# Patient Record
Sex: Female | Born: 1990 | Race: White | Hispanic: No | Marital: Single | Smoking: Current every day smoker
Health system: Southern US, Community
[De-identification: ages and names within clinical notes are randomized; demographics above are authoritative.]

## PROBLEM LIST (undated history)

## (undated) DIAGNOSIS — N760 Acute vaginitis: Secondary | ICD-10-CM

## (undated) DIAGNOSIS — A5901 Trichomonal vulvovaginitis: Secondary | ICD-10-CM

## (undated) DIAGNOSIS — R87629 Unspecified abnormal cytological findings in specimens from vagina: Secondary | ICD-10-CM

## (undated) DIAGNOSIS — R7303 Prediabetes: Secondary | ICD-10-CM

## (undated) DIAGNOSIS — B9689 Other specified bacterial agents as the cause of diseases classified elsewhere: Secondary | ICD-10-CM

## (undated) DIAGNOSIS — N83209 Unspecified ovarian cyst, unspecified side: Secondary | ICD-10-CM

## (undated) HISTORY — DX: Trichomonal vulvovaginitis: A59.01

## (undated) HISTORY — PX: NOSE SURGERY: SHX723

## (undated) HISTORY — DX: Other specified bacterial agents as the cause of diseases classified elsewhere: B96.89

## (undated) HISTORY — DX: Acute vaginitis: N76.0

## (undated) HISTORY — DX: Unspecified abnormal cytological findings in specimens from vagina: R87.629

---
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
## 2010-02-09 IMAGING — CT CT MAXILLOFACIAL WITHOUT CONTRAST
1 series · 15 of 30 positions shown, 19 images · non-contrast
Comparison: none

REASON FOR EXAM: left orbit pain and nasal bones/ fall    ENT   preg test
pending
COMMENTS:   LMP: > one month ago

[Series 2: facial 3.0 h60f · axial · 0.34mm/px · z∈[+1094,+1196]mm · 15 of 38 slices shown, 19 images]
[im 2/38  brain]
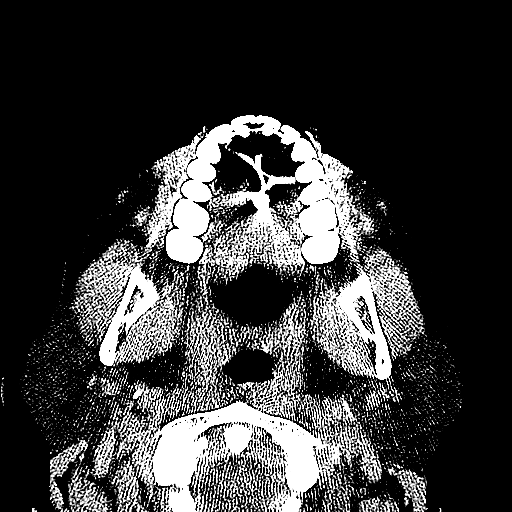
[im 2/38  bone]
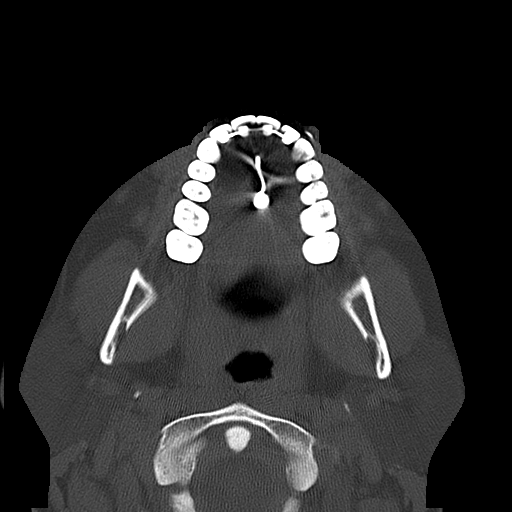
[im 4/38  bone]
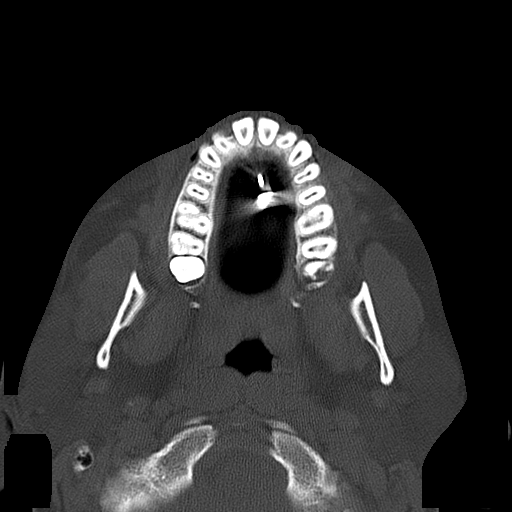
[im 7/38  bone]
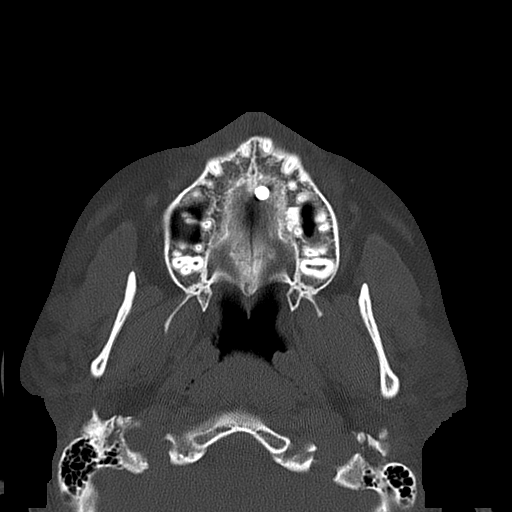
[im 9/38  bone]
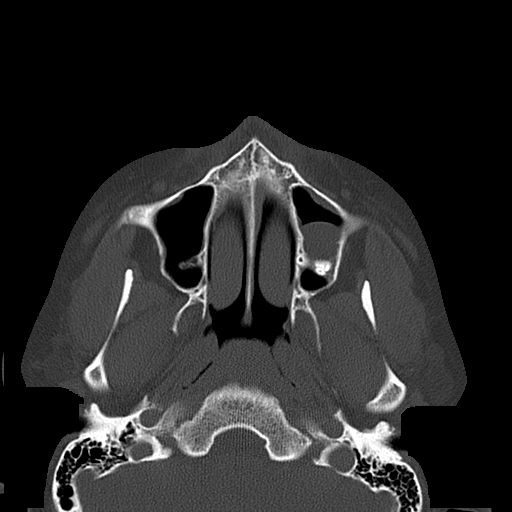
[im 12/38  brain]
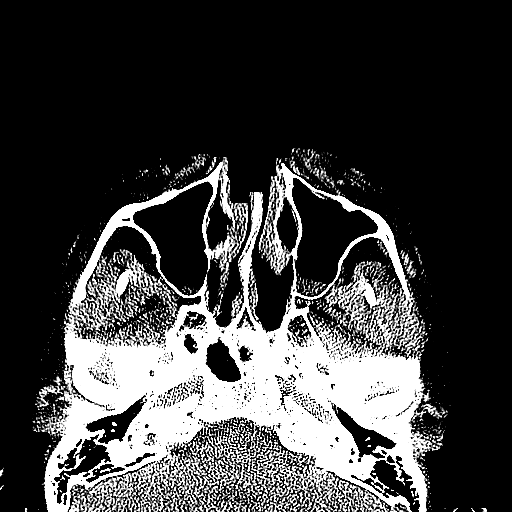
[im 12/38  bone]
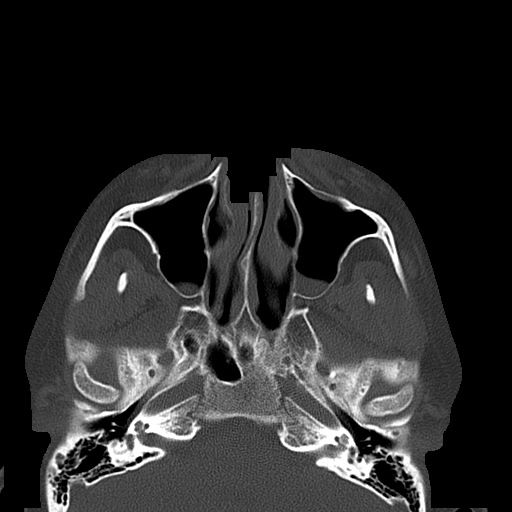
[im 15/38  bone]
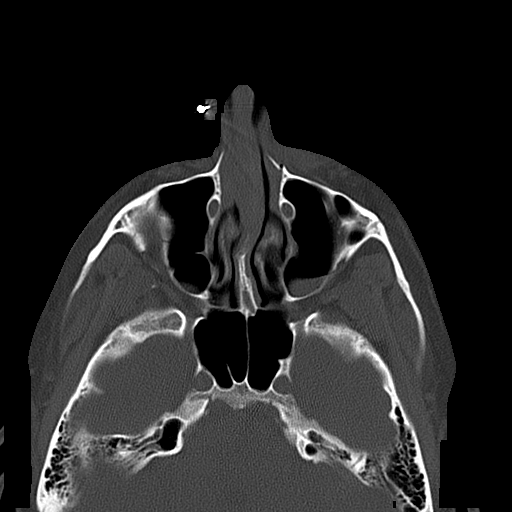
[im 17/38  bone]
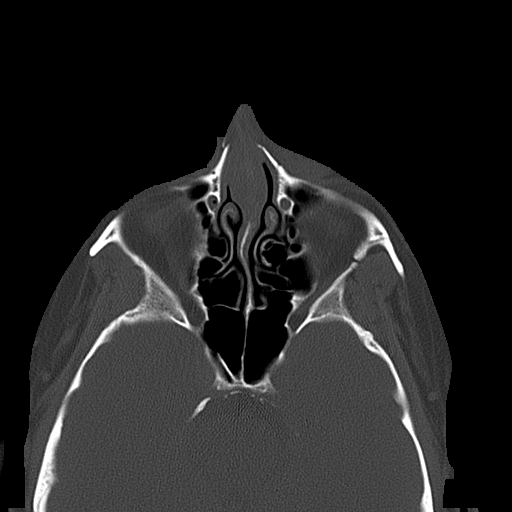
[im 20/38  bone]
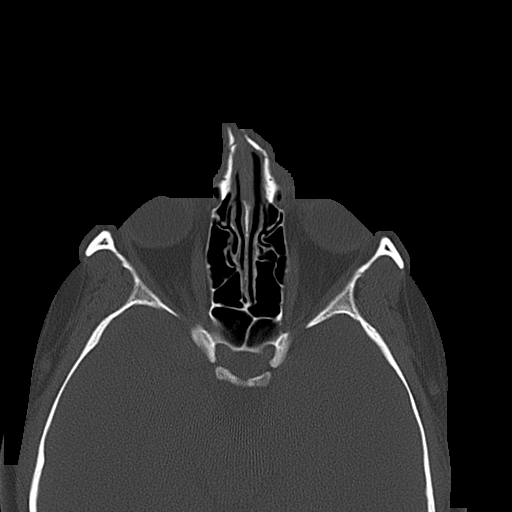
[im 21/38  brain]
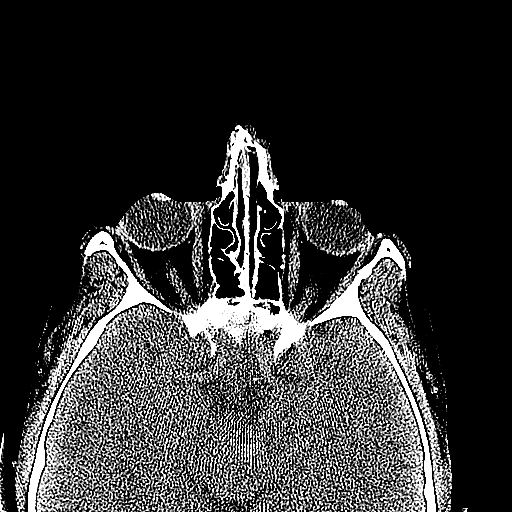
[im 21/38  bone]
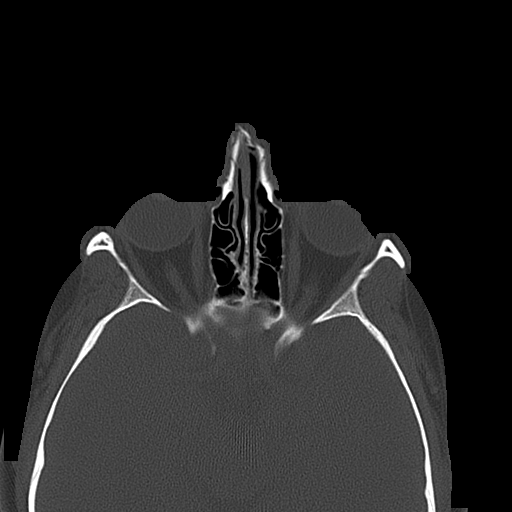
[im 23/38  bone]
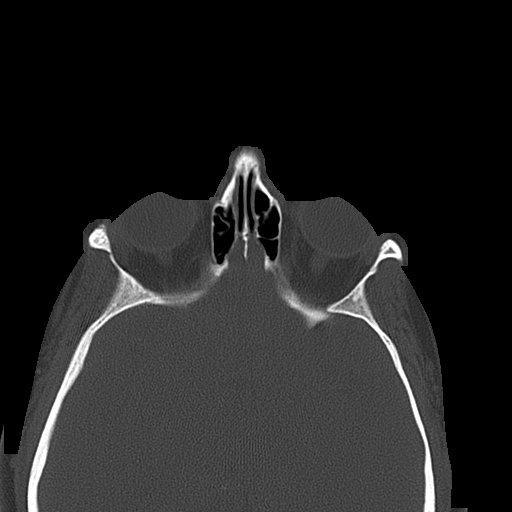
[im 26/38  bone]
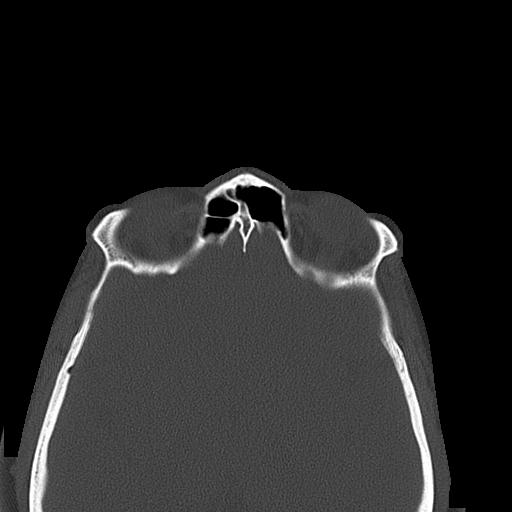
[im 29/38  bone]
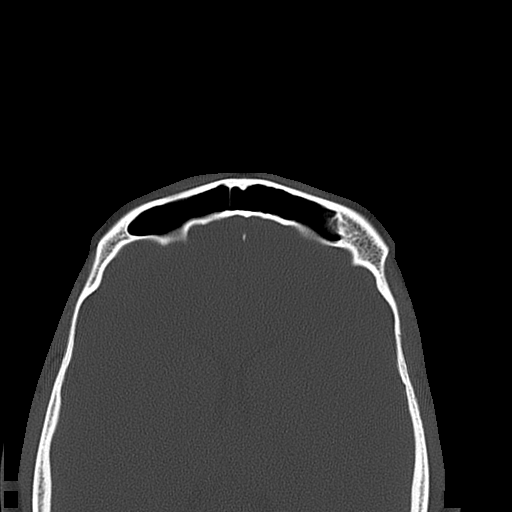
[im 31/38  brain]
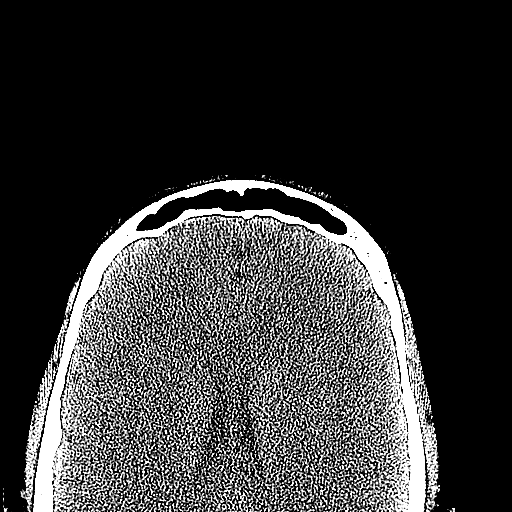
[im 31/38  bone]
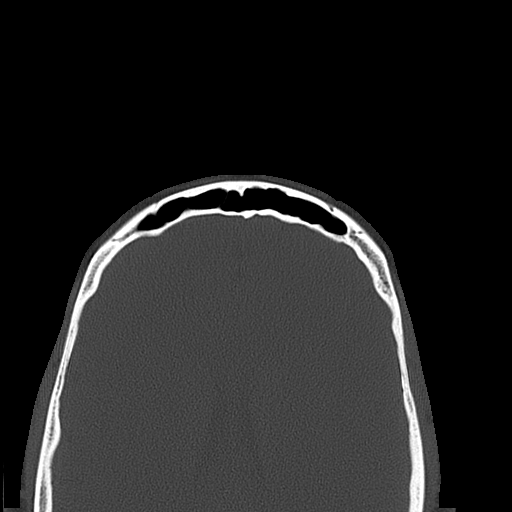
[im 34/38  bone]
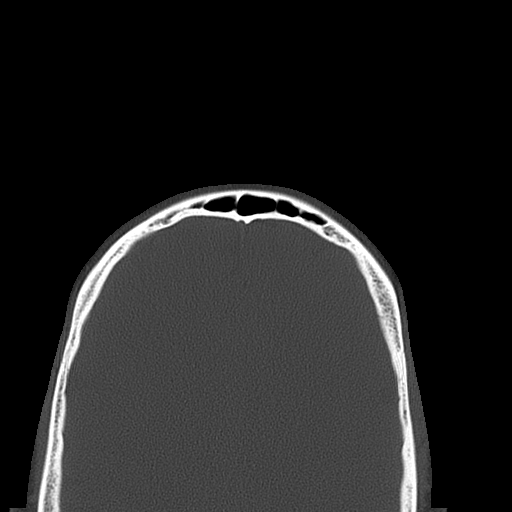
[im 36/38  bone]
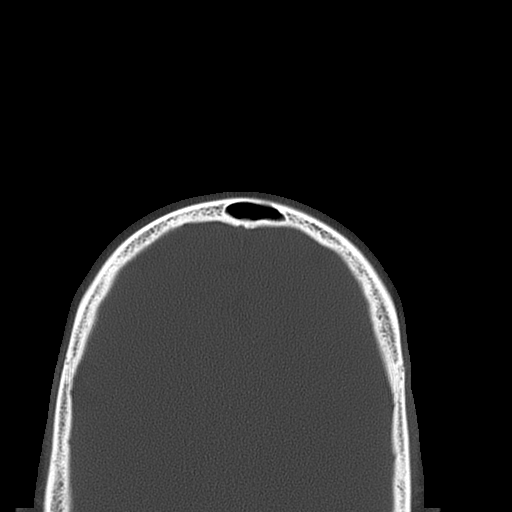

[15 of 30 positions shown; findings below may reference images not displayed]

PROCEDURE:     CT  - CT MAXILLOFACIAL AREA WO  - February 10, 2008  [DATE]

RESULT:     Multiplanar imaging of the bones of the face were obtained
utilizing bone algorithm.

Bilateral nasal bone fracture is identified involving the tip of the RIGHT
and LEFT nasal bones demonstrating angulation to the RIGHT and minimal
displacement of the LEFT fracture.  A nondisplaced fracture is demonstrated
along the base of the LEFT nasal bone.  Multiple fractures are identified
along the RIGHT nasal bone. Mild air fluid levels are identified within the
RIGHT and LEFT maxillary sinuses which may represent the sequela of sinus
disease or possibly posttraumatic. A fracture is also identified along the
anterior wall of the LEFT maxillary sinus. Extraocular muscles appear to be
intact.
IMPRESSION: 1.     Multiple fractures involving the nasal bones with resulting
angulation to the LEFT.
2.     Minimally depressed fracture along the anterior wall of the maxillary
sinus on the LEFT.  Mild bilateral air fluid levels which may be secondary
to the previously described trauma or possibly due to sinus disease.
3.     Erick Uriel Teichroeb of the Emergency Department was informed of these
findings at the time of the initial interpretation.

## 2011-11-22 ENCOUNTER — Encounter (HOSPITAL_COMMUNITY): Payer: Self-pay | Admitting: Emergency Medicine

## 2011-11-22 DIAGNOSIS — F17200 Nicotine dependence, unspecified, uncomplicated: Secondary | ICD-10-CM | POA: Insufficient documentation

## 2011-11-22 DIAGNOSIS — R109 Unspecified abdominal pain: Secondary | ICD-10-CM | POA: Insufficient documentation

## 2011-11-22 NOTE — ED Notes (Signed)
Patient c/o abdominal pain x 1 1/2 months; states has been having nausea.

## 2011-11-23 ENCOUNTER — Emergency Department (HOSPITAL_COMMUNITY)
Admission: EM | Admit: 2011-11-23 | Discharge: 2011-11-23 | Disposition: A | Discharge status: Home / Self Care | Payer: Self-pay | Attending: Emergency Medicine | Admitting: Emergency Medicine

## 2011-11-23 DIAGNOSIS — K2970 Gastritis, unspecified, without bleeding: Secondary | ICD-10-CM

## 2011-11-23 HISTORY — DX: Unspecified ovarian cyst, unspecified side: N83.209

## 2011-11-23 LAB — URINALYSIS, ROUTINE W REFLEX MICROSCOPIC
Ketones, ur: NEGATIVE mg/dL
Leukocytes, UA: NEGATIVE
Nitrite: POSITIVE — AB
Protein, ur: NEGATIVE mg/dL
Urobilinogen, UA: 0.2 mg/dL (ref 0.0–1.0)

## 2011-11-23 LAB — URINE MICROSCOPIC-ADD ON

## 2011-11-23 LAB — PREGNANCY, URINE: Preg Test, Ur: NEGATIVE

## 2011-11-23 MED ORDER — FAMOTIDINE 20 MG PO TABS
20.0000 mg | ORAL_TABLET | Freq: Once | ORAL | Status: AC
  Administered 2011-11-23: 20 mg via ORAL
  Filled 2011-11-23: qty 1

## 2011-11-23 MED ORDER — ACETAMINOPHEN 500 MG PO TABS
1000.0000 mg | ORAL_TABLET | Freq: Once | ORAL | Status: AC
  Administered 2011-11-23: 1000 mg via ORAL
  Filled 2011-11-23: qty 2

## 2011-11-23 NOTE — ED Provider Notes (Signed)
History     CSN: [id number]  Arrival date & time 11/22/11  2330   First MD Initiated Contact with Patient 11/23/11 0048      Chief Complaint  Patient presents with  . Abdominal Pain    (Consider location/radiation/quality/duration/timing/severity/associated sxs/prior treatment) HPI Melanie Short is a 21 y.o. female who presents to the Emergency Department complaining of epigastric pain present for 1.5 months. Pain is aching in nature, made better with eating. Worse at night without reflux. It is associated with fatigue and a headache. She has taken no medicines.    Past Medical History  Diagnosis Date  . Ovarian cyst     Past Surgical History  Procedure Date  . Nose surgery     No family history on file.  History  Substance Use Topics  . Smoking status: Current Every Day Smoker  . Smokeless tobacco: Not on file  . Alcohol Use: Yes     occ    OB History    Grav Para Term Preterm Abortions TAB SAB Ect Mult Living                  Review of Systems  Constitutional: Positive for fatigue. Negative for fever and appetite change.       10 Systems reviewed and are negative for acute change except as noted in the HPI.  HENT: Negative for congestion.   Eyes: Negative for discharge and redness.  Respiratory: Negative for cough and shortness of breath.   Cardiovascular: Negative for chest pain.  Gastrointestinal: Positive for abdominal pain. Negative for vomiting.  Musculoskeletal: Negative for back pain.  Skin: Negative for rash.  Neurological: Positive for headaches. Negative for syncope and numbness.  Psychiatric/Behavioral:       No behavior change.    Allergies  Review of patient's allergies indicates no known allergies.  Home Medications  No current outpatient prescriptions on file.  BP 106/64  Pulse 79  Temp 98.4 F (36.9 C) (Oral)  Resp 18  Ht 5' 5.5" (1.664 m)  Wt 210 lb (95.255 kg)  BMI 34.41 kg/m2  SpO2 100%  LMP 10/05/2011  Physical Exam    Nursing note and vitals reviewed. Constitutional: She is oriented to person, place, and time. She appears well-developed and well-nourished.       Awake, alert, nontoxic appearance.  HENT:  Head: Atraumatic.  Eyes: Right eye exhibits no discharge. Left eye exhibits no discharge.  Neck: Neck supple.  Cardiovascular: Normal heart sounds.   Pulmonary/Chest: Effort normal and breath sounds normal. She exhibits no tenderness.  Abdominal: Soft. There is tenderness. There is no rebound.       Mild epigastric pain with palpation.  Musculoskeletal: Normal range of motion. She exhibits no tenderness.       Baseline ROM, no obvious new focal weakness.  Neurological: She is alert and oriented to person, place, and time. No cranial nerve deficit.       Mental status and motor strength appears baseline for patient and situation.  Skin: No rash noted.  Psychiatric: She has a normal mood and affect.    ED Course  Procedures (including critical care time)  Results for orders placed during the hospital encounter of 11/23/11  URINALYSIS, ROUTINE W REFLEX MICROSCOPIC      Component Value Range   Color, Urine YELLOW  YELLOW   APPearance CLEAR  CLEAR   Specific Gravity, Urine 1.020  1.005 - 1.030   pH 7.0  5.0 - 8.0   Glucose, UA  NEGATIVE  NEGATIVE mg/dL   Hgb urine dipstick NEGATIVE  NEGATIVE   Bilirubin Urine NEGATIVE  NEGATIVE   Ketones, ur NEGATIVE  NEGATIVE mg/dL   Protein, ur NEGATIVE  NEGATIVE mg/dL   Urobilinogen, UA 0.2  0.0 - 1.0 mg/dL   Nitrite POSITIVE (*) NEGATIVE   Leukocytes, UA NEGATIVE  NEGATIVE  PREGNANCY, URINE      Component Value Range   Preg Test, Ur NEGATIVE  NEGATIVE  URINE MICROSCOPIC-ADD ON      Component Value Range   Squamous Epithelial / LPF FEW (*) RARE   WBC, UA 0-2  <3 WBC/hpf   RBC / HPF 0-2  <3 RBC/hpf   Bacteria, UA MANY (*) RARE   No results found.   No diagnosis found.    MDM  Patient with abdominal pain x 1.5 month made better with eating.  Given pepcid. Given tylenol for current mild headache. Counseled on dietary changes to help with reflux. Pt stable in ED with no significant deterioration in condition.The patient appears reasonably screened and/or stabilized for discharge and I doubt any other medical condition or other [facility] requiring further screening, evaluation, or treatment in the ED at this time prior to discharge.  MDM Reviewed: nursing note and vitals Interpretation: labs           Nicoletta Dress. Colon Branch, MD 11/23/11 [phone]

## 2016-08-26 ENCOUNTER — Other Ambulatory Visit (HOSPITAL_COMMUNITY)
Admission: RE | Admit: 2016-08-26 | Discharge: 2016-08-26 | Disposition: A | Discharge status: Home / Self Care | Payer: Self-pay | Source: Ambulatory Visit | Attending: Nurse Practitioner | Admitting: Nurse Practitioner

## 2016-08-26 DIAGNOSIS — N871 Moderate cervical dysplasia: Secondary | ICD-10-CM | POA: Insufficient documentation

## 2016-08-26 HISTORY — PX: COLPOSCOPY: SHX161

## 2016-10-27 ENCOUNTER — Encounter: Payer: Self-pay | Admitting: *Deleted

## 2016-10-28 ENCOUNTER — Encounter: Payer: Self-pay | Admitting: Obstetrics & Gynecology

## 2016-10-28 ENCOUNTER — Ambulatory Visit (INDEPENDENT_AMBULATORY_CARE_PROVIDER_SITE_OTHER): Payer: Self-pay | Admitting: Obstetrics & Gynecology

## 2016-10-28 VITALS — BP 110/74 | HR 74 | Ht 65.5 in | Wt 268.0 lb

## 2016-10-28 DIAGNOSIS — R87613 High grade squamous intraepithelial lesion on cytologic smear of cervix (HGSIL): Secondary | ICD-10-CM

## 2016-10-28 DIAGNOSIS — F17200 Nicotine dependence, unspecified, uncomplicated: Secondary | ICD-10-CM

## 2016-10-28 NOTE — Progress Notes (Signed)
Preoperative History and Physical  Melanie Short is a 26 y.o. G0P0000 with Patient's last menstrual period was 10/23/2016. admitted for a laser ablation of the cervix for multifocal high-grade dysplasia by colposcopically directed biopsies.   PMH:    Past Medical History:  Diagnosis Date  . Bacterial vaginosis   . Ovarian cyst   . Ovarian cyst   . Trichomonas vaginitis   . Vaginal Pap smear, abnormal    LGSIL    PSH:     Past Surgical History:  Procedure Laterality Date  . COLPOSCOPY  08/26/2016  . NOSE SURGERY      POb/GynH:      OB History    Gravida Para Term Preterm AB Living   0 0 0 0 0 0   SAB TAB Ectopic Multiple Live Births   0 0 0 0 0      SH:   Social History  Substance Use Topics  . Smoking status: Current Every Day Smoker    Packs/day: 1.00    Types: Cigarettes  . Smokeless tobacco: Never Used  . Alcohol use Yes     Comment: occ    FH:    Family History  Problem Relation Age of Onset  . Fibromyalgia Paternal Grandmother   . Hypertension Paternal Grandmother   . Heart attack Maternal Grandmother   . Stroke Maternal Grandmother   . Cancer Maternal Grandmother        kidney  . Asthma Father   . Ovarian cysts Mother   . Fibromyalgia Mother   . Cancer Paternal Grandfather        lung     Allergies: No Known Allergies  Medications:       Current Outpatient Prescriptions:  .  norgestrel-ethinyl estradiol (CRYSELLE-28) 0.3-30 MG-MCG tablet, Take 1 tablet by mouth daily., Disp: , Rfl:   Review of Systems:   Review of Systems  Constitutional: Negative for fever, chills, weight loss, malaise/fatigue and diaphoresis.  HENT: Negative for hearing loss, ear pain, nosebleeds, congestion, sore throat, neck pain, tinnitus and ear discharge.   Eyes: Negative for blurred vision, double vision, photophobia, pain, discharge and redness.  Respiratory: Negative for cough, hemoptysis, sputum production, shortness of breath, wheezing and stridor.    Cardiovascular: Negative for chest pain, palpitations, orthopnea, claudication, leg swelling and PND.  Gastrointestinal: Positive for abdominal pain. Negative for heartburn, nausea, vomiting, diarrhea, constipation, blood in stool and melena.  Genitourinary: Negative for dysuria, urgency, frequency, hematuria and flank pain.  Musculoskeletal: Negative for myalgias, back pain, joint pain and falls.  Skin: Negative for itching and rash.  Neurological: Negative for dizziness, tingling, tremors, sensory change, speech change, focal weakness, seizures, loss of consciousness, weakness and headaches.  Endo/Heme/Allergies: Negative for environmental allergies and polydipsia. Does not bruise/bleed easily.  Psychiatric/Behavioral: Negative for depression, suicidal ideas, hallucinations, memory loss and substance abuse. The patient is not nervous/anxious and does not have insomnia.      PHYSICAL EXAM:  Blood pressure 110/74, pulse 74, height 5' 5.5" (1.664 m), weight 268 lb (121.6 kg), last menstrual period 10/23/2016.    Vitals reviewed. Constitutional: She is oriented to person, place, and time. She appears well-developed and well-nourished.  HENT:  Head: Normocephalic and atraumatic.  Right Ear: External ear normal.  Left Ear: External ear normal.  Nose: Nose normal.  Mouth/Throat: Oropharynx is clear and moist.  Eyes: Conjunctivae and EOM are normal. Pupils are equal, round, and reactive to light. Right eye exhibits no discharge. Left eye exhibits no discharge. No  scleral icterus.  Neck: Normal range of motion. Neck supple. No tracheal deviation present. No thyromegaly present.  Cardiovascular: Normal rate, regular rhythm, normal heart sounds and intact distal pulses.  Exam reveals no gallop and no friction rub.   No murmur heard. Respiratory: Effort normal and breath sounds normal. No respiratory distress. She has no wheezes. She has no rales. She exhibits no tenderness.  GI: Soft. Bowel  sounds are normal. She exhibits no distension and no mass. There is tenderness. There is no rebound and no guarding.  Genitourinary:       Vulva is normal without lesions Vagina is pink moist without discharge Cervix normal in appearance and pap is normal Uterus is normal size, contour, position, consistency, mobility, non-tender Adnexa is negative with normal sized ovaries by sonogram  Musculoskeletal: Normal range of motion. She exhibits no edema and no tenderness.  Neurological: She is alert and oriented to person, place, and time. She has normal reflexes. She displays normal reflexes. No cranial nerve deficit. She exhibits normal muscle tone. Coordination normal.  Skin: Skin is warm and dry. No rash noted. No erythema. No pallor.  Psychiatric: She has a normal mood and affect. Her behavior is normal. Judgment and thought content normal.    Labs: No results found for this or any previous visit (from the past 336 hour(s)).  EKG: No orders found for this or any previous visit.  Imaging Studies: No results found.    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  The summary and outline of the counseling and care coordination is summarized in the note above.   All questions were answered.

## 2016-11-11 ENCOUNTER — Encounter: Payer: Self-pay | Admitting: Obstetrics & Gynecology

## 2016-11-18 ENCOUNTER — Other Ambulatory Visit: Payer: Self-pay | Admitting: Obstetrics & Gynecology

## 2016-11-18 NOTE — Patient Instructions (Signed)
Melanie Short  11/18/2016     @PREFPERIOPPHARMACY @   Your procedure is scheduled on  11/26/2016 .  [facility] to [facility] at  [facility]  [facility].M.  Call this number if you have problems the morning of surgery:  [phone]   Remember:  Do not eat food or drink liquids after midnight.  Take these medicines the morning of surgery with A SIP OF WATER  None   Do not wear jewelry, make-up or nail polish.  Do not wear lotions, powders, or perfumes, or deoderant.  Do not shave 48 hours prior to surgery.  Men may shave face and neck.  Do not bring valuables to the hospital.  [facility] belongings or valuables.  Contacts, dentures or bridgework may not be worn into surgery.  Leave your suitcase in the car.  After surgery it may be brought to your room.  For patients admitted to the hospital, discharge time will be determined by your treatment team.  Patients discharged the day of surgery will not be allowed to drive home.   Name and phone number of your driver:   family Special instructions:  None  Please read over the following fact sheets that you were given. Anesthesia Post-op Instructions and Care and Recovery After Surgery       Cervical Laser Surgery Cervical laser surgery is a procedure that uses a focused beam of light (laser) to shrink, destroy, or remove tissue from the opening of the uterus (cervix). You may have this surgery if:  You have abnormal cells (dysplasia) in your cervix that are an early sign of cancer.  Tissue from your cervix is to be tested for signs of disease.  Tell a health care provider about:  Any allergies you have.  All medicines you are taking, including vitamins, herbs, eye drops, creams, and over-the-counter medicines.  Any problems you or family members have had with anesthetic medicines.  Any blood disorders you have.  Any surgeries you have had.  Any medical conditions you have.  Whether you are  pregnant or may be pregnant. What are the risks? Generally, this is a safe procedure. However, problems may occur, including:  Bleeding.  Infection.  Allergic reactions to medicines or dyes.  Damage to other structures or organs.  Narrowing of the cervix (cervical stenosis). This can lead to infertility, cervical incompetency, and miscarriage or preterm labor in a future pregnancy.  What happens before the procedure?  Ask your health care provider about changing or stopping your regular medicines. This is especially important if you are taking diabetes medicines or blood thinners.  For at least 24 hours before the procedure: ? Do not have sex. ? Do not use tampons. ? Do not douche. ? Do not use vaginal creams or medicines. What happens during the procedure?  To lower your risk of infection, your health care team will wash or sanitize their hands.  You will lie on your back with your feet raised in footrests (stirrups).  A device called a speculum will be put into your vagina to hold it open.  You will be given a medicine to numb the area (local anesthetic). You may feel cramping when the medicine is injected.  A magnifying instrument called a colposcope will be placed into your vagina. It will shine a light and allow your health care provider to see your vagina and cervix.  A solution will be swabbed on your cervix to  help your health care provider see the tissue.  A thin, flexible tube called an endoscope will be inserted into your vagina. The laser will be on the end of the endoscope.  The laser will be used to shrink, destroy, or remove the tissue.  A cotton swab soaked in solution may be used to remove any burned or destroyed tissue.  A paste may be applied to your cervix to stop bleeding. The procedure may vary among health care providers and hospitals. What happens after the procedure?  You may feel some pain or cramping for a few days.  You may have some  bleeding and brownish discharge.  Wear sanitary pads for discharge.  Do not have sex or put anything in your vagina until your health care provider says it is okay.  Your health care provider may recommend that you limit your physical activity for a few days. Ask your health care provider what activities are safe for you. Summary  Cervical laser surgery is a procedure that uses a focused beam of light (laser) to shrink, destroy, or remove tissue from the cervix.  You may have this procedure if you have abnormal cells in your cervix that are an early sign of cancer, or if tissue from your cervix is to be tested for signs of disease.  Generally, this is a safe procedure. However, problems may occur.  Do not have sex, use tampons, douche, or use vaginal creams or medicines for at least 24 hours before the procedure.  You may have pain, cramping, bleeding, and discharge for a few days after the procedure. This information is not intended to replace advice given to you by your health care provider. Make sure you discuss any questions you have with your health care provider. Document Released: 01/14/2016 Document Revised: 01/14/2016 Document Reviewed: 01/14/2016 Elsevier Interactive Patient Education  2018 [employer].  Cervical Laser Surgery, Care After This sheet gives you information about how to care for yourself after your procedure. Your health care provider may also give you more specific instructions. If you have problems or questions, contact your health care provider. What can I expect after the procedure? After the procedure, it is common to have:  Pain or discomfort.  Mild cramping.  Bleeding, spotting, or brownish discharge from your vagina.  Follow these instructions at home: Activity  Return to your normal activities as told by your health care provider. Ask your health care provider what activities are safe for you.  Do not lift anything that is heavier than 10 lb  (4.5 kg), or the limit that your health care provider tells you, until he or she says that it is safe.  Do not have sex or put anything in your vagina until your health care provider says it is okay. General instructions  Take over-the-counter and prescription medicines only as told by your health care provider.  Do not drive or use heavy machinery while taking prescription pain medicine.  Wear sanitary pads to protect from bleeding, spotting, and discharge.  Do not use tampons or douche until your health care provider says it is okay.  It is up to you to get the results of your procedure. Ask your health care provider, or the department that is doing the procedure, when your results will be ready.  Keep all follow-up visits as told by your health care provider. This is important. Contact a health care provider if:  Your pain or cramping does not improve.  Your periods are more painful  than usual.  You do not get your period as expected. Get help right away if:  You have any symptoms of infection, such as: ? A fever. ? Chills. ? Discharge that smells bad.  You have severe pain in your abdomen.  You have heavy bleeding from your vagina (more than a normal period).  You have vaginal bleeding with clumps of blood (blood clots). Summary  After this procedure, it is common to have pain or discomfort and mild cramping. It is also common to have bleeding, spotting, or brownish discharge from your vagina.  You may need to wear sanitary pads to protect from bleeding, spotting, and discharge.  Do not have sex, use tampons, or douche until your health care provider says it is okay.  Return to your normal activities as told by your health care provider. Ask your health care provider what activities are safe for you.  Take over-the-counter and prescription medicines only as told by your health care provider. These include medicines for pain. This information is not intended to  replace advice given to you by your health care provider. Make sure you discuss any questions you have with your health care provider. Document Released: 01/14/2016 Document Revised: 01/14/2016 Document Reviewed: 01/14/2016 Elsevier Interactive Patient Education  2018 [employer].  General Anesthesia, Adult General anesthesia is the use of medicines to make a person "go to sleep" (be unconscious) for a medical procedure. General anesthesia is often recommended when a procedure:  Is long.  Requires you to be still or in an unusual position.  Is major and can cause you to lose blood.  Is impossible to do without general anesthesia.  The medicines used for general anesthesia are called general anesthetics. In addition to making you sleep, the medicines:  Prevent pain.  Control your blood pressure.  Relax your muscles.  Tell a health care provider about:  Any allergies you have.  All medicines you are taking, including vitamins, herbs, eye drops, creams, and over-the-counter medicines.  Any problems you or family members have had with anesthetic medicines.  Types of anesthetics you have had in the past.  Any bleeding disorders you have.  Any surgeries you have had.  Any medical conditions you have.  Any history of heart or lung conditions, such as heart failure, sleep apnea, or chronic obstructive pulmonary disease (COPD).  Whether you are pregnant or may be pregnant.  Whether you use tobacco, alcohol, marijuana, or street drugs.  Any history of Financial planner.  Any history of depression or anxiety. What are the risks? Generally, this is a safe procedure. However, problems may occur, including:  Allergic reaction to anesthetics.  Lung and heart problems.  Inhaling food or liquids from your stomach into your lungs (aspiration).  Injury to nerves.  Waking up during your procedure and being unable to move (rare).  Extreme agitation or a state of mental  confusion (delirium) when you wake up from the anesthetic.  Air in the bloodstream, which can lead to stroke.  These problems are more likely to develop if you are having a major surgery or if you have an advanced medical condition. You can prevent some of these complications by answering all of your health care provider's questions thoroughly and by following all pre-procedure instructions. General anesthesia can cause side effects, including:  Nausea or vomiting  A sore throat from the breathing tube.  Feeling cold or shivery.  Feeling tired, washed out, or achy.  Sleepiness or drowsiness.  Confusion or agitation.  What happens before the procedure? Staying hydrated Follow instructions from your health care provider about hydration, which may include:  Up to 2 hours before the procedure - you may continue to drink clear liquids, such as water, clear fruit juice, black coffee, and plain tea.  Eating and drinking restrictions Follow instructions from your health care provider about eating and drinking, which may include:  8 hours before the procedure - stop eating heavy meals or foods such as meat, fried foods, or fatty foods.  6 hours before the procedure - stop eating light meals or foods, such as toast or cereal.  6 hours before the procedure - stop drinking milk or drinks that contain milk.  2 hours before the procedure - stop drinking clear liquids.  Medicines  Ask your health care provider about: ? Changing or stopping your regular medicines. This is especially important if you are taking diabetes medicines or blood thinners. ? Taking medicines such as aspirin and ibuprofen. These medicines can thin your blood. Do not take these medicines before your procedure if your health care provider instructs you not to. ? Taking new dietary supplements or medicines. Do not take these during the week before your procedure unless your health care provider approves them.  If you  are told to take a medicine or to continue taking a medicine on the day of the procedure, take the medicine with sips of water. General instructions   Ask if you will be going home the same day, the following day, or after a longer hospital stay. ? Plan to have someone take you home. ? Plan to have someone stay with you for the first 24 hours after you leave the hospital or clinic.  For 3-6 weeks before the procedure, try not to use any tobacco products, such as cigarettes, chewing tobacco, and e-cigarettes.  You may brush your teeth on the morning of the procedure, but make sure to spit out the toothpaste. What happens during the procedure?  You will be given anesthetics through a mask and through an IV tube in one of your veins.  You may receive medicine to help you relax (sedative).  As soon as you are asleep, a breathing tube may be used to help you breathe.  An anesthesia specialist will stay with you throughout the procedure. He or she will help keep you comfortable and safe by continuing to give you medicines and adjusting the amount of medicine that you get. He or she will also watch your blood pressure, pulse, and oxygen levels to make sure that the anesthetics do not cause any problems.  If a breathing tube was used to help you breathe, it will be removed before you wake up. The procedure may vary among health care providers and hospitals. What happens after the procedure?  You will wake up, often slowly, after the procedure is complete, usually in a recovery area.  Your blood pressure, heart rate, breathing rate, and blood oxygen level will be monitored until the medicines you were given have worn off.  You may be given medicine to help you calm down if you feel anxious or agitated.  If you will be going home the same day, your health care provider may check to make sure you can stand, drink, and urinate.  Your health care providers will treat your pain and side effects  before you go home.  Do not drive for 24 hours if you received a sedative.  You may: ? Feel nauseous and vomit. ?  Have a sore throat. ? Have mental slowness. ? Feel cold or shivery. ? Feel sleepy. ? Feel tired. ? Feel sore or achy, even in parts of your body where you did not have surgery. This information is not intended to replace advice given to you by your health care provider. Make sure you discuss any questions you have with your health care provider. Document Released: 06/03/2007 Document Revised: 08/07/2015 Document Reviewed: 02/08/2015 Elsevier Interactive Patient Education  2018 [employer]. General Anesthesia, Adult, Care After These instructions provide you with information about caring for yourself after your procedure. Your health care provider may also give you more specific instructions. Your treatment has been planned according to current medical practices, but problems sometimes occur. Call your health care provider if you have any problems or questions after your procedure. What can I expect after the procedure? After the procedure, it is common to have:  Vomiting.  A sore throat.  Mental slowness.  It is common to feel:  Nauseous.  Cold or shivery.  Sleepy.  Tired.  Sore or achy, even in parts of your body where you did not have surgery.  Follow these instructions at home: For at least 24 hours after the procedure:  Do not: ? Participate in activities where you could fall or become injured. ? Drive. ? Use heavy machinery. ? Drink alcohol. ? Take sleeping pills or medicines that cause drowsiness. ? Make important decisions or sign legal documents. ? Take care of children on your own.  Rest. Eating and drinking  If you vomit, drink water, juice, or soup when you can drink without vomiting.  Drink enough fluid to keep your urine clear or pale yellow.  Make sure you have little or no nausea before eating solid foods.  Follow the diet  recommended by your health care provider. General instructions  Have a responsible adult stay with you until you are awake and alert.  Return to your normal activities as told by your health care provider. Ask your health care provider what activities are safe for you.  Take over-the-counter and prescription medicines only as told by your health care provider.  If you smoke, do not smoke without supervision.  Keep all follow-up visits as told by your health care provider. This is important. Contact a health care provider if:  You continue to have nausea or vomiting at home, and medicines are not helpful.  You cannot drink fluids or start eating again.  You cannot urinate after 8-12 hours.  You develop a skin rash.  You have fever.  You have increasing redness at the site of your procedure. Get help right away if:  You have difficulty breathing.  You have chest pain.  You have unexpected bleeding.  You feel that you are having a life-threatening or urgent problem. This information is not intended to replace advice given to you by your health care provider. Make sure you discuss any questions you have with your health care provider. Document Released: 06/02/2000 Document Revised: 07/30/2015 Document Reviewed: 02/08/2015 Elsevier Interactive Patient Education  [employer].

## 2016-11-20 ENCOUNTER — Encounter (HOSPITAL_COMMUNITY)
Admission: RE | Admit: 2016-11-20 | Discharge: 2016-11-20 | Disposition: A | Discharge status: Home / Self Care | Payer: Self-pay | Source: Ambulatory Visit | Attending: Obstetrics & Gynecology | Admitting: Obstetrics & Gynecology

## 2016-11-20 ENCOUNTER — Encounter (HOSPITAL_COMMUNITY): Payer: Self-pay

## 2016-11-20 DIAGNOSIS — Z01818 Encounter for other preprocedural examination: Secondary | ICD-10-CM | POA: Insufficient documentation

## 2016-11-20 DIAGNOSIS — R87613 High grade squamous intraepithelial lesion on cytologic smear of cervix (HGSIL): Secondary | ICD-10-CM | POA: Insufficient documentation

## 2016-11-20 LAB — CBC
HCT: 40.9 % (ref 36.0–46.0)
Hemoglobin: 13.8 g/dL (ref 12.0–15.0)
MCH: 30.9 pg (ref 26.0–34.0)
MCHC: 33.7 g/dL (ref 30.0–36.0)
MCV: 91.5 fL (ref 78.0–100.0)
PLATELETS: 289 10*3/uL (ref 150–400)
RBC: 4.47 MIL/uL (ref 3.87–5.11)
RDW: 13 % (ref 11.5–15.5)
WBC: 7.5 10*3/uL (ref 4.0–10.5)

## 2016-11-20 LAB — COMPREHENSIVE METABOLIC PANEL
ALT: 16 U/L (ref 14–54)
AST: 20 U/L (ref 15–41)
Albumin: 3.9 g/dL (ref 3.5–5.0)
Alkaline Phosphatase: 49 U/L (ref 38–126)
Anion gap: 8 (ref 5–15)
BUN: 13 mg/dL (ref 6–20)
CHLORIDE: 106 mmol/L (ref 101–111)
CO2: 24 mmol/L (ref 22–32)
CREATININE: 0.87 mg/dL (ref 0.44–1.00)
Calcium: 9 mg/dL (ref 8.9–10.3)
GFR calc Af Amer: >60 mL/min (ref >60)
Glucose, Bld: 144 mg/dL — ABNORMAL HIGH (ref 65–99)
POTASSIUM: 3.8 mmol/L (ref 3.5–5.1)
Sodium: 138 mmol/L (ref 135–145)
TOTAL PROTEIN: 6.9 g/dL (ref 6.5–8.1)
Total Bilirubin: 0.5 mg/dL (ref 0.3–1.2)

## 2016-11-20 LAB — URINALYSIS, ROUTINE W REFLEX MICROSCOPIC
Bacteria, UA: NONE SEEN
Bilirubin Urine: NEGATIVE
GLUCOSE, UA: NEGATIVE mg/dL
Hgb urine dipstick: NEGATIVE
KETONES UR: 20 mg/dL — AB
Leukocytes, UA: NEGATIVE
Nitrite: NEGATIVE
PROTEIN: 30 mg/dL — AB
Specific Gravity, Urine: 1.026 (ref 1.005–1.030)
pH: 5 (ref 5.0–8.0)

## 2016-11-20 LAB — RAPID HIV SCREEN (HIV 1/2 AB+AG)
HIV 1/2 Antibodies: NONREACTIVE
HIV-1 P24 ANTIGEN - HIV24: NONREACTIVE

## 2016-11-20 LAB — HCG, QUANTITATIVE, PREGNANCY

## 2016-12-05 ENCOUNTER — Encounter (HOSPITAL_COMMUNITY): Payer: Self-pay

## 2016-12-05 ENCOUNTER — Encounter (HOSPITAL_COMMUNITY)
Admission: RE | Admit: 2016-12-05 | Discharge: 2016-12-05 | Disposition: A | Discharge status: Home / Self Care | Payer: Self-pay | Source: Ambulatory Visit | Attending: Obstetrics & Gynecology | Admitting: Obstetrics & Gynecology

## 2016-12-05 DIAGNOSIS — Z01818 Encounter for other preprocedural examination: Secondary | ICD-10-CM | POA: Insufficient documentation

## 2016-12-08 ENCOUNTER — Other Ambulatory Visit: Payer: Self-pay | Admitting: Obstetrics & Gynecology

## 2016-12-08 ENCOUNTER — Encounter: Payer: Self-pay | Admitting: Obstetrics & Gynecology

## 2016-12-10 ENCOUNTER — Ambulatory Visit (HOSPITAL_COMMUNITY)
Admission: RE | Admit: 2016-12-10 | Discharge: 2016-12-10 | Disposition: A | Discharge status: Home / Self Care | Payer: Self-pay | Source: Ambulatory Visit | Attending: Obstetrics & Gynecology | Admitting: Obstetrics & Gynecology

## 2016-12-10 ENCOUNTER — Ambulatory Visit (HOSPITAL_COMMUNITY): Payer: Self-pay | Admitting: Anesthesiology

## 2016-12-10 ENCOUNTER — Encounter (HOSPITAL_COMMUNITY): Payer: Self-pay | Admitting: *Deleted

## 2016-12-10 ENCOUNTER — Encounter (HOSPITAL_COMMUNITY)
Admission: RE | Disposition: A | Discharge status: Home / Self Care | Payer: Self-pay | Source: Ambulatory Visit | Attending: Obstetrics & Gynecology

## 2016-12-10 DIAGNOSIS — N879 Dysplasia of cervix uteri, unspecified: Secondary | ICD-10-CM | POA: Insufficient documentation

## 2016-12-10 DIAGNOSIS — F17210 Nicotine dependence, cigarettes, uncomplicated: Secondary | ICD-10-CM | POA: Insufficient documentation

## 2016-12-10 DIAGNOSIS — R87613 High grade squamous intraepithelial lesion on cytologic smear of cervix (HGSIL): Secondary | ICD-10-CM

## 2016-12-10 HISTORY — PX: CERVICAL ABLATION: SHX5771

## 2016-12-10 LAB — PREGNANCY, URINE: Preg Test, Ur: NEGATIVE

## 2016-12-10 SURGERY — ABLATION, CERVIX
Anesthesia: General | Site: Cervix

## 2016-12-10 MED ORDER — LIDOCAINE HCL (PF) 1 % IJ SOLN
INTRAMUSCULAR | Status: DC | PRN
  Administered 2016-12-10: 40 mg

## 2016-12-10 MED ORDER — CEFAZOLIN SODIUM-DEXTROSE 2-4 GM/100ML-% IV SOLN
2.0000 g | INTRAVENOUS | Status: AC
  Administered 2016-12-10: 2 g via INTRAVENOUS
  Filled 2016-12-10: qty 100

## 2016-12-10 MED ORDER — PROPOFOL 10 MG/ML IV BOLUS
INTRAVENOUS | Status: DC | PRN
  Administered 2016-12-10: 200 mg via INTRAVENOUS

## 2016-12-10 MED ORDER — KETOROLAC TROMETHAMINE 30 MG/ML IJ SOLN
30.0000 mg | Freq: Once | INTRAMUSCULAR | Status: AC
  Administered 2016-12-10: 30 mg via INTRAVENOUS
  Filled 2016-12-10: qty 1

## 2016-12-10 MED ORDER — FERRIC SUBSULFATE 259 MG/GM EX SOLN
CUTANEOUS | Status: DC | PRN
  Administered 2016-12-10: 1 via TOPICAL

## 2016-12-10 MED ORDER — ONDANSETRON 8 MG PO TBDP: 8.0000 mg | ORAL_TABLET | Freq: Three times a day (TID) | ORAL | 0 refills | Status: DC | PRN

## 2016-12-10 MED ORDER — ONDANSETRON HCL 4 MG/2ML IJ SOLN
4.0000 mg | Freq: Once | INTRAMUSCULAR | Status: AC
  Administered 2016-12-10: 4 mg via INTRAVENOUS

## 2016-12-10 MED ORDER — GLYCOPYRROLATE 0.2 MG/ML IJ SOLN
0.2000 mg | Freq: Once | INTRAMUSCULAR | Status: AC
  Administered 2016-12-10: 0.2 mg via INTRAVENOUS

## 2016-12-10 MED ORDER — LIDOCAINE HCL (PF) 1 % IJ SOLN
INTRAMUSCULAR | Status: AC
Start: 2016-12-10 — End: ?
  Filled 2016-12-10: qty 5

## 2016-12-10 MED ORDER — FENTANYL CITRATE (PF) 100 MCG/2ML IJ SOLN
INTRAMUSCULAR | Status: DC | PRN
  Administered 2016-12-10 (×2): 50 ug via INTRAVENOUS

## 2016-12-10 MED ORDER — MIDAZOLAM HCL 2 MG/2ML IJ SOLN
INTRAMUSCULAR | Status: AC
  Filled 2016-12-10: qty 2

## 2016-12-10 MED ORDER — PROPOFOL 10 MG/ML IV BOLUS
INTRAVENOUS | Status: AC
  Filled 2016-12-10: qty 20

## 2016-12-10 MED ORDER — KETOROLAC TROMETHAMINE 10 MG PO TABS: 10.0000 mg | ORAL_TABLET | Freq: Three times a day (TID) | ORAL | 0 refills | Status: DC | PRN

## 2016-12-10 MED ORDER — CEFAZOLIN SODIUM-DEXTROSE 2-4 GM/100ML-% IV SOLN: 2.0000 g | INTRAVENOUS | Status: DC

## 2016-12-10 MED ORDER — MIDAZOLAM HCL 2 MG/2ML IJ SOLN
1.0000 mg | INTRAMUSCULAR | Status: AC
Start: 2016-12-10 — End: 2016-12-10
  Administered 2016-12-10 (×2): 2 mg via INTRAVENOUS
  Filled 2016-12-10: qty 2

## 2016-12-10 MED ORDER — ONDANSETRON HCL 4 MG/2ML IJ SOLN
INTRAMUSCULAR | Status: AC
  Filled 2016-12-10: qty 2

## 2016-12-10 MED ORDER — ACETIC ACID 4% SOLUTION
Status: DC | PRN
  Administered 2016-12-10: 1 via TOPICAL

## 2016-12-10 MED ORDER — HYDROCODONE-ACETAMINOPHEN 5-325 MG PO TABS: 1.0000 | ORAL_TABLET | Freq: Four times a day (QID) | ORAL | 0 refills | Status: DC | PRN

## 2016-12-10 MED ORDER — LACTATED RINGERS IV SOLN
INTRAVENOUS | Status: DC
  Administered 2016-12-10 (×2): via INTRAVENOUS

## 2016-12-10 MED ORDER — CEFAZOLIN SODIUM-DEXTROSE 1-4 GM/50ML-% IV SOLN
1.0000 g | Freq: Once | INTRAVENOUS | Status: AC
  Administered 2016-12-10: 1 g via INTRAVENOUS
  Filled 2016-12-10: qty 50

## 2016-12-10 MED ORDER — FENTANYL CITRATE (PF) 100 MCG/2ML IJ SOLN: 25.0000 ug | INTRAMUSCULAR | Status: DC | PRN

## 2016-12-10 MED ORDER — FENTANYL CITRATE (PF) 100 MCG/2ML IJ SOLN
INTRAMUSCULAR | Status: AC
  Filled 2016-12-10: qty 2

## 2016-12-10 MED ORDER — FERRIC SUBSULFATE 259 MG/GM EX SOLN
CUTANEOUS | Status: DC | PRN
Start: 2016-12-10 — End: 2016-12-10
  Administered 2016-12-10: 1 via TOPICAL

## 2016-12-10 MED ORDER — SUCCINYLCHOLINE CHLORIDE 20 MG/ML IJ SOLN
INTRAMUSCULAR | Status: AC
  Filled 2016-12-10: qty 1

## 2016-12-10 MED ORDER — SUCCINYLCHOLINE CHLORIDE 20 MG/ML IJ SOLN
INTRAMUSCULAR | Status: DC | PRN
  Administered 2016-12-10: 20 mg via INTRAVENOUS
  Administered 2016-12-10: 130 mg via INTRAVENOUS

## 2016-12-10 MED ORDER — GLYCOPYRROLATE 0.2 MG/ML IJ SOLN
INTRAMUSCULAR | Status: AC
  Filled 2016-12-10: qty 1

## 2016-12-10 SURGICAL SUPPLY — 25 items
APPLICATOR COTTON TIP 6IN STRL (MISCELLANEOUS) ×3 IMPLANT
BAG HAMPER (MISCELLANEOUS) ×3 IMPLANT
CLOTH BEACON ORANGE TIMEOUT ST (SAFETY) ×3 IMPLANT
COVER LIGHT HANDLE STERIS (MISCELLANEOUS) ×6 IMPLANT
COVER TABLE BACK 60X90 (DRAPES) ×3 IMPLANT
GAUZE SPONGE 4X4 16PLY XRAY LF (GAUZE/BANDAGES/DRESSINGS) ×3 IMPLANT
GLOVE BIOGEL PI IND STRL 7.0 (GLOVE) ×1 IMPLANT
GLOVE BIOGEL PI IND STRL 7.5 (GLOVE) ×1 IMPLANT
GLOVE BIOGEL PI IND STRL 8 (GLOVE) ×1 IMPLANT
GLOVE BIOGEL PI INDICATOR 7.0 (GLOVE) ×2
GLOVE BIOGEL PI INDICATOR 7.5 (GLOVE) ×2
GLOVE BIOGEL PI INDICATOR 8 (GLOVE) ×2
GLOVE ECLIPSE 8.0 STRL XLNG CF (GLOVE) ×3 IMPLANT
GOWN STRL REUS W/TWL LRG LVL3 (GOWN DISPOSABLE) ×3 IMPLANT
GOWN STRL REUS W/TWL XL LVL3 (GOWN DISPOSABLE) ×3 IMPLANT
KIT ROOM TURNOVER APOR (KITS) ×3 IMPLANT
LASER FIBER DISP 1000U (UROLOGICAL SUPPLIES) ×3 IMPLANT
MANIFOLD NEPTUNE II (INSTRUMENTS) ×3 IMPLANT
MARKER SKIN DUAL TIP RULER LAB (MISCELLANEOUS) ×3 IMPLANT
PAD ARMBOARD 7.5X6 YLW CONV (MISCELLANEOUS) ×3 IMPLANT
PREFILTER SMOKE EVAC (FILTER) ×3 IMPLANT
SET BASIN LINEN APH (SET/KITS/TRAYS/PACK) ×3 IMPLANT
SWAB PROCTOSCOPIC (MISCELLANEOUS) ×6 IMPLANT
TUBING SMOKE EVAC CO2 (TUBING) ×3 IMPLANT
WATER STERILE IRR 1000ML POUR (IV SOLUTION) ×6 IMPLANT

## 2016-12-10 NOTE — Discharge Instructions (Signed)
Cervical Laser Surgery, Care After °This sheet gives you information about how to care for yourself after your procedure. Your health care provider may also give you more specific instructions. If you have problems or questions, contact your health care provider. °What can I expect after the procedure? °After the procedure, it is common to have: °· Pain or discomfort. °· Mild cramping. °· Bleeding, spotting, or brownish discharge from your vagina. ° °Follow these instructions at home: °Activity °· Return to your normal activities as told by your health care provider. Ask your health care provider what activities are safe for you. °· Do not lift anything that is heavier than 10 lb (4.5 kg), or the limit that your health care provider tells you, until he or she says that it is safe. °· Do not have sex or put anything in your vagina until your health care provider says it is okay. °General instructions °· Take over-the-counter and prescription medicines only as told by your health care provider. °· Do not drive or use heavy machinery while taking prescription pain medicine. °· Wear sanitary pads to protect from bleeding, spotting, and discharge. °· Do not use tampons or douche until your health care provider says it is okay. °· It is up to you to get the results of your procedure. Ask your health care provider, or the department that is doing the procedure, when your results will be ready. °· Keep all follow-up visits as told by your health care provider. This is important. °Contact a health care provider if: °· Your pain or cramping does not improve. °· Your periods are more painful than usual. °· You do not get your period as expected. °Get help right away if: °· You have any symptoms of infection, such as: °? A fever. °? Chills. °? Discharge that smells bad. °· You have severe pain in your abdomen. °· You have heavy bleeding from your vagina (more than a normal period). °· You have vaginal bleeding with clumps of  blood (blood clots). °Summary °· After this procedure, it is common to have pain or discomfort and mild cramping. It is also common to have bleeding, spotting, or brownish discharge from your vagina. °· You may need to wear sanitary pads to protect from bleeding, spotting, and discharge. °· Do not have sex, use tampons, or douche until your health care provider says it is okay. °· Return to your normal activities as told by your health care provider. Ask your health care provider what activities are safe for you. °· Take over-the-counter and prescription medicines only as told by your health care provider. These include medicines for pain. °This information is not intended to replace advice given to you by your health care provider. Make sure you discuss any questions you have with your health care provider. °Document Released: 01/14/2016 Document Revised: 01/14/2016 Document Reviewed: 01/14/2016 °Elsevier Interactive Patient Education © 2018 Elsevier Inc. ° °

## 2016-12-10 NOTE — Anesthesia Preprocedure Evaluation (Addendum)
Anesthesia Evaluation  Patient identified by MRN, date of birth, ID band Patient awake    Reviewed: Allergy & Precautions, NPO status , Patient's Chart, lab work & pertinent test results  Airway Mallampati: I  TM Distance: >3 FB Neck ROM: Full    Dental  (+) Teeth Intact   Pulmonary Current Smoker,    breath sounds clear to auscultation       Cardiovascular negative cardio ROS   Rhythm:Regular Rate:Normal     Neuro/Psych negative neurological ROS     GI/Hepatic negative GI ROS, (+)     substance abuse  marijuana use,   Endo/Other  Morbid obesity  Renal/GU      Musculoskeletal   Abdominal   Peds  Hematology   Anesthesia Other Findings   Reproductive/Obstetrics                            Anesthesia Physical Anesthesia Plan  ASA: II  Anesthesia Plan: General   Post-op Pain Management:    Induction: Intravenous  PONV Risk Score and Plan:   Airway Management Planned: LMA  Additional Equipment:   Intra-op Plan:   Post-operative Plan: Extubation in OR  Informed Consent: I have reviewed the patients History and Physical, chart, labs and discussed the procedure including the risks, benefits and alternatives for the proposed anesthesia with the patient or authorized representative who has indicated his/her understanding and acceptance.     Plan Discussed with:   Anesthesia Plan Comments:         Anesthesia Quick Evaluation

## 2016-12-10 NOTE — Op Note (Signed)
Preoperative Diagnosis:  High Grade Squamous Intraepithelial lesion, adequate colposcopy, multifocal disease  Postoperative Diagnosis:  Same as above  Procedure:  Laser ablation of the cervix  Surgeon:  Lazaro Arms MD  Anaesthesia:  Laryngeal Mask Airway  Findings:  Patient had an abnormal pap smear which was evaluated at [facility] with colposcopy and directed biopsies. Multiple biopsies returned as HSIL.  The colposcopy was adequate.  As a result, the patient is admitted for laser ablation of the cervix. Ideally we would do a conization  Description of Note:  Patient was taken to the OR and placed in the supine position where she underwent laryngeal mask airway anaesthesia.  She was placed in the dorsal lithotomy position.  She was draped for laser.  Graves speculum was placed and 3% acetic acid used and the laser microscope employed to perform colposcopy which confirmed the office findings.  Laser was used on typical cervical settings and used to vaporized the squamocolumnar junction to  depth of  5-7 mm peripherally and 7-9 mm centrally.  Surgical margin of several mm was employed beyond the acetowhite epithelium.  Hemostasis was achieved with the laser and Monsel's solution.  Patient was awakened from anaesthesia in good stable condition and all counts were correct.  She received Ancef 2 gram and Toradol 30 mg IV preoperatively prophylactically.  Lazaro Arms, MD 1:53 PM 12/10/2016

## 2016-12-10 NOTE — H&P (Signed)
Preoperative History and Physical  Melanie Short is a 26 y.o. G0P0000 with Patient's last menstrual period was 10/23/2016. 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   PMH:        Past Medical History:  Diagnosis Date  . Bacterial vaginosis   . Ovarian cyst   . Ovarian cyst   . Trichomonas vaginitis   . Vaginal Pap smear, abnormal    LGSIL    PSH:          Past Surgical History:  Procedure Laterality Date  . COLPOSCOPY  08/26/2016  . NOSE SURGERY      POb/GynH:              OB History    Gravida Para Term Preterm AB Living   0 0 0 0 0 0   SAB TAB Ectopic Multiple Live Births   0 0 0 0 0      SH:          Social History  Substance Use Topics  . Smoking status: Current Every Day Smoker    Packs/day: 1.00    Types: Cigarettes  . Smokeless tobacco: Never Used  . Alcohol use Yes      Comment: occ    FH:         Family History  Problem Relation Age of Onset  . Fibromyalgia Paternal Grandmother   . Hypertension Paternal Grandmother   . Heart attack Maternal Grandmother   . Stroke Maternal Grandmother   . Cancer Maternal Grandmother        kidney  . Asthma Father   . Ovarian cysts Mother   . Fibromyalgia Mother   . Cancer Paternal Grandfather        lung     Allergies: No Known Allergies  Medications:       Current Outpatient Prescriptions:  .  norgestrel-ethinyl estradiol (CRYSELLE-28) 0.3-30 MG-MCG tablet, Take 1 tablet by mouth daily., Disp: , Rfl:   Review of Systems:   Review of Systems  Constitutional: Negative for fever, chills, weight loss, malaise/fatigue and diaphoresis.  HENT: Negative for hearing loss, ear pain, nosebleeds, congestion, sore throat, neck pain, tinnitus and ear discharge.   Eyes: Negative for blurred vision, double vision,  photophobia, pain, discharge and redness.  Respiratory: Negative for cough, hemoptysis, sputum production, shortness of breath, wheezing and stridor.   Cardiovascular: Negative for chest pain, palpitations, orthopnea, claudication, leg swelling and PND.  Gastrointestinal: Positive for abdominal pain. Negative for heartburn, nausea, vomiting, diarrhea, constipation, blood in stool and melena.  Genitourinary: Negative for dysuria, urgency, frequency, hematuria and flank pain.  Musculoskeletal: Negative for myalgias, back pain, joint pain and falls.  Skin: Negative for itching and rash.  Neurological: Negative for dizziness, tingling, tremors, sensory change, speech change, focal weakness, seizures, loss of consciousness, weakness and headaches.  Endo/Heme/Allergies: Negative for environmental allergies and polydipsia. Does not bruise/bleed easily.  Psychiatric/Behavioral: Negative for depression, suicidal ideas, hallucinations, memory loss and substance abuse. The patient is not nervous/anxious and does not have insomnia.      PHYSICAL EXAM:  Blood pressure 110/74, pulse 74, height 5' 5.5" (1.664 m), weight 268 lb (121.6 kg), last menstrual period 10/23/2016.    Vitals reviewed. Constitutional: She is oriented to person, place, and time. She appears well-developed and well-nourished.  HENT:  Head: Normocephalic and atraumatic.  Right Ear: External ear normal.  Left  Ear: External ear normal.  Nose: Nose normal.  Mouth/Throat: Oropharynx is clear and moist.  Eyes: Conjunctivae and EOM are normal. Pupils are equal, round, and reactive to light. Right eye exhibits no discharge. Left eye exhibits no discharge. No scleral icterus.  Neck: Normal range of motion. Neck supple. No tracheal deviation present. No thyromegaly present.  Cardiovascular: Normal rate, regular rhythm, normal heart sounds and intact distal pulses.  Exam reveals no gallop and no friction rub.   No murmur  heard. Respiratory: Effort normal and breath sounds normal. No respiratory distress. She has no wheezes. She has no rales. She exhibits no tenderness.  GI: Soft. Bowel sounds are normal. She exhibits no distension and no mass. There is tenderness. There is no rebound and no guarding.  Genitourinary:       Vulva is normal without lesions Vagina is pink moist without discharge Cervix normal in appearance and pap is normal Uterus is normal size, contour, position, consistency, mobility, non-tender Adnexa is negative with normal sized ovaries by sonogram  Musculoskeletal: Normal range of motion. She exhibits no edema and no tenderness.  Neurological: She is alert and oriented to person, place, and time. She has normal reflexes. She displays normal reflexes. No cranial nerve deficit. She exhibits normal muscle tone. Coordination normal.  Skin: Skin is warm and dry. No rash noted. No erythema. No pallor.  Psychiatric: She has a normal mood and affect. Her behavior is normal. Judgment and thought content normal.    Labs: No results found for this or any previous visit (from the past 336 hour(s)).  EKG: No orders found for this or any previous visit.  Imaging Studies: ImagingResults  No results found.      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

## 2016-12-10 NOTE — Anesthesia Postprocedure Evaluation (Signed)
Anesthesia Post Note  Patient: Anushree Dorsi  Procedure(s) Performed: LASER ABLATION OF CERVIX (N/A Cervix)  Patient location during evaluation: PACU Anesthesia Type: General Level of consciousness: awake, awake and alert, oriented and patient cooperative Pain management: pain level controlled Vital Signs Assessment: post-procedure vital signs reviewed and stable Respiratory status: spontaneous breathing, nonlabored ventilation, respiratory function stable and patient connected to nasal cannula oxygen Cardiovascular status: stable Postop Assessment: no apparent nausea or vomiting Anesthetic complications: no     Last Vitals:  Vitals:   12/10/16 1245 12/10/16 1300  BP: 105/66 97/60  Pulse:    Resp: (!) 27 20  Temp:    SpO2: 96% 96%    Last Pain:  Vitals:   12/10/16 1213  TempSrc: Oral                 Shavana Calder L

## 2016-12-10 NOTE — Anesthesia Procedure Notes (Signed)
Procedure Name: Intubation Date/Time: 12/10/2016 1:18 PM Performed by: Raenette Rover Pre-anesthesia Checklist: Patient identified, Emergency Drugs available, Suction available and Patient being monitored Patient Re-evaluated:Patient Re-evaluated prior to induction Oxygen Delivery Method: Circle system utilized Preoxygenation: Pre-oxygenation with 100% oxygen Induction Type: IV induction Ventilation: Mask ventilation without difficulty Laryngoscope Size: Mac and 3 Grade View: Grade I Tube type: Oral Tube size: 7.0 mm Number of attempts: 1 Airway Equipment and Method: Stylet Placement Confirmation: ETT inserted through vocal cords under direct vision,  positive ETCO2,  CO2 detector and breath sounds checked- equal and bilateral Secured at: 21 cm Tube secured with: Tape Dental Injury: Teeth and Oropharynx as per pre-operative assessment

## 2016-12-10 NOTE — Transfer of Care (Signed)
Immediate Anesthesia Transfer of Care Note  Patient: Melanie Short  Procedure(s) Performed: LASER ABLATION OF CERVIX (N/A Cervix)  Patient Location: PACU  Anesthesia Type:General  Level of Consciousness: awake, alert , oriented and patient cooperative  Airway & Oxygen Therapy: Patient Spontanous Breathing and Patient connected to nasal cannula oxygen  Post-op Assessment: Report given to RN and Post -op Vital signs reviewed and stable  Post vital signs: Reviewed and stable  Last Vitals:  Vitals:   12/10/16 1245 12/10/16 1300  BP: 105/66 97/60  Pulse:    Resp: (!) 27 20  Temp:    SpO2: 96% 96%    Last Pain:  Vitals:   12/10/16 1213  TempSrc: Oral      Patients Stated Pain Goal: 8 (12/10/16 1213)  Complications: No apparent anesthesia complications

## 2016-12-12 ENCOUNTER — Encounter (HOSPITAL_COMMUNITY): Payer: Self-pay | Admitting: Obstetrics & Gynecology

## 2016-12-18 ENCOUNTER — Encounter: Payer: Self-pay | Admitting: Obstetrics & Gynecology

## 2016-12-22 ENCOUNTER — Encounter: Payer: Self-pay | Admitting: Obstetrics & Gynecology

## 2016-12-22 ENCOUNTER — Ambulatory Visit (INDEPENDENT_AMBULATORY_CARE_PROVIDER_SITE_OTHER): Payer: Self-pay | Admitting: Obstetrics & Gynecology

## 2016-12-22 VITALS — BP 100/62 | HR 65 | Ht 65.0 in | Wt 268.0 lb

## 2016-12-22 DIAGNOSIS — R87613 High grade squamous intraepithelial lesion on cytologic smear of cervix (HGSIL): Secondary | ICD-10-CM

## 2016-12-22 NOTE — Progress Notes (Signed)
  HPI: Patient returns for routine postoperative follow-up having undergone laser ablation of the cervix due to high grade dysplasia on colposcopically directed biopsies on 12/10/2016.  The patient's immediate postoperative recovery has been unremarkable. Since hospital discharge the patient reports no problems.   Current Outpatient Prescriptions: Multiple Vitamin (MULTIVITAMIN WITH MINERALS) TABS tablet, Take 1 tablet by mouth 3 (three) times a week., Disp: , Rfl:  norgestrel-ethinyl estradiol (CRYSELLE-28) 0.3-30 MG-MCG tablet, Take 1 tablet by mouth daily., Disp: , Rfl:  HYDROcodone-acetaminophen (NORCO/VICODIN) 5-325 MG tablet, Take 1 tablet by mouth every 6 (six) hours as needed. (Patient not taking: Reported on 12/22/2016), Disp: 20 tablet, Rfl: 0 ibuprofen (ADVIL,MOTRIN) 200 MG tablet, Take 600 mg by mouth every 8 (eight) hours as needed (for pain.)., Disp: , Rfl:  ketorolac (TORADOL) 10 MG tablet, Take 1 tablet (10 mg total) by mouth every 8 (eight) hours as needed. (Patient not taking: Reported on 12/22/2016), Disp: 15 tablet, Rfl: 0 ondansetron (ZOFRAN ODT) 8 MG disintegrating tablet, Take 1 tablet (8 mg total) by mouth every 8 (eight) hours as needed for nausea or vomiting. (Patient not taking: Reported on 12/22/2016), Disp: 20 tablet, Rfl: 0  No current facility-administered medications for this visit.     Blood pressure 100/62, pulse 65, height  (1.651 m), weight 268 lb (121.6 kg), last menstrual period 12/17/2016.  Physical Exam: Cervical laser bed is healing well  Diagnostic Tests:   Pathology: n/a  Impression: S/P laser ablation of the cervix  Plan: No sex for 4 weeks Follow up Pap every 6 months for 2 years  Follow up: 6  months  Lazaro Arms, MD

## 2017-06-22 ENCOUNTER — Encounter: Payer: Self-pay | Admitting: Obstetrics & Gynecology

## 2017-06-22 ENCOUNTER — Ambulatory Visit (INDEPENDENT_AMBULATORY_CARE_PROVIDER_SITE_OTHER): Payer: Self-pay | Admitting: Obstetrics & Gynecology

## 2017-06-22 ENCOUNTER — Other Ambulatory Visit: Payer: Self-pay

## 2017-06-22 ENCOUNTER — Other Ambulatory Visit (HOSPITAL_COMMUNITY)
Admission: RE | Admit: 2017-06-22 | Discharge: 2017-06-22 | Disposition: A | Discharge status: Home / Self Care | Payer: Self-pay | Source: Ambulatory Visit | Attending: Obstetrics & Gynecology | Admitting: Obstetrics & Gynecology

## 2017-06-22 VITALS — BP 118/80 | HR 69 | Ht 65.5 in | Wt 271.0 lb

## 2017-06-22 DIAGNOSIS — Z87410 Personal history of cervical dysplasia: Secondary | ICD-10-CM | POA: Insufficient documentation

## 2017-06-22 NOTE — Progress Notes (Signed)
Chief Complaint  Patient presents with  . pap smear only      27 y.o. G0P0000 Patient's last menstrual period was 06/19/2017. The current method of family planning is OCP (estrogen/progesterone).  Outpatient Encounter Medications as of 06/22/2017  Medication Sig  . Multiple Vitamin (MULTIVITAMIN WITH MINERALS) TABS tablet Take 1 tablet by mouth 3 (three) times a week.  . norgestrel-ethinyl estradiol (CRYSELLE-28) 0.3-30 MG-MCG tablet Take 1 tablet by mouth daily.  Marland Kitchen ibuprofen (ADVIL,MOTRIN) 200 MG tablet Take 600 mg by mouth every 8 (eight) hours as needed (for pain.).  . [DISCONTINUED] HYDROcodone-acetaminophen (NORCO/VICODIN) 5-325 MG tablet Take 1 tablet by mouth every 6 (six) hours as needed. (Patient not taking: Reported on 12/22/2016)  . [DISCONTINUED] ketorolac (TORADOL) 10 MG tablet Take 1 tablet (10 mg total) by mouth every 8 (eight) hours as needed. (Patient not taking: Reported on 12/22/2016)  . [DISCONTINUED] ondansetron (ZOFRAN ODT) 8 MG disintegrating tablet Take 1 tablet (8 mg total) by mouth every 8 (eight) hours as needed for nausea or vomiting. (Patient not taking: Reported on 12/22/2016)   No facility-administered encounter medications on file as of 06/22/2017.     Subjective Follow up Pap s/p laser ablation for HSIL 12/2016 No complaints Past Medical History:  Diagnosis Date  . Bacterial vaginosis   . Ovarian cyst   . Ovarian cyst   . Trichomonas vaginitis   . Vaginal Pap smear, abnormal    LGSIL    Past Surgical History:  Procedure Laterality Date  . CERVICAL ABLATION N/A 12/10/2016   Procedure: LASER ABLATION OF CERVIX;  Surgeon: Lazaro Arms, MD;  Location: [facility];  Service: Gynecology;  Laterality: N/A;  . COLPOSCOPY  08/26/2016  . NOSE SURGERY     broken nose    OB History    Gravida  0   Para  0   Term  0   Preterm  0   AB  0   Living  0     SAB  0   TAB  0   Ectopic  0   Multiple  0   Live Births  0            No Known Allergies  Social History   Socioeconomic History  . Marital status: Single    Spouse name: Not on file  . Number of children: Not on file  . Years of education: Not on file  . Highest education level: Not on file  Occupational History  . Not on file  Social Needs  . Financial resource strain: Not on file  . Food insecurity:    Worry: Not on file    Inability: Not on file  . Transportation needs:    Medical: Not on file    Non-medical: Not on file  Tobacco Use  . Smoking status: Current Every Day Smoker    Packs/day: 0.00    Years: 9.00    Pack years: 0.00    Types: E-cigarettes  . Smokeless tobacco: Never Used  Substance and Sexual Activity  . Alcohol use: Yes    Comment: occ  . Drug use: Yes    Types: Marijuana    Comment: daily  . Sexual activity: Yes    Birth control/protection: Condom, Pill  Lifestyle  . Physical activity:    Days per week: Not on file    Minutes per session: Not on file  . Stress: Not on file  Relationships  . Social connections:  Talks on phone: Not on file    Gets together: Not on file    Attends religious service: Not on file    Active member of club or organization: Not on file    Attends meetings of clubs or organizations: Not on file    Relationship status: Not on file  Other Topics Concern  . Not on file  Social History Narrative  . Not on file    Family History  Problem Relation Age of Onset  . Fibromyalgia Paternal Grandmother   . Hypertension Paternal Grandmother   . Heart attack Maternal Grandmother   . Stroke Maternal Grandmother   . Cancer Maternal Grandmother        kidney  . Asthma Father   . Ovarian cysts Mother   . Fibromyalgia Mother   . Cancer Paternal Grandfather        lung    Medications:       Current Outpatient Medications:  Marland Kitchen.  Multiple Vitamin (MULTIVITAMIN WITH MINERALS) TABS tablet, Take 1 tablet by mouth 3 (three) times a week., Disp: , Rfl:  .  norgestrel-ethinyl estradiol  (CRYSELLE-28) 0.3-30 MG-MCG tablet, Take 1 tablet by mouth daily., Disp: , Rfl:  .  ibuprofen (ADVIL,MOTRIN) 200 MG tablet, Take 600 mg by mouth every 8 (eight) hours as needed (for pain.)., Disp: , Rfl:   Objective Blood pressure 118/80, pulse 69, height 5' 5.5" (1.664 m), weight 271 lb (122.9 kg), last menstrual period 06/19/2017.  General WDWN female NAD Vulva:  normal appearing vulva with no masses, tenderness or lesions Vagina:  normal mucosa, no discharge Cervix:  no bleeding following Pap, no cervical motion tenderness and no lesions Uterus: NE  Adnexa: ovaries:,  NE   Pertinent ROS  Labs or studies     Impression Diagnoses this Encounter::   ICD-10-CM   1. History of high grade cervical dysplasia Z87.410    S/P laser ablation 12/2016    Established relevant diagnosis(es):   Plan/Recommendations: No orders of the defined types were placed in this encounter.   Labs or Scans Ordered: No orders of the defined types were placed in this encounter.   Management:: Surveillance pap is performed s/p laser ablation of the cervix 12/2016  Continue every 6 months for 2 years  Follow up Return in about 6 months (around 12/22/2017) for Follow up  Pap, with Dr Despina HiddenEure.       All questions were answered.

## 2017-06-24 LAB — CYTOLOGY - PAP
DIAGNOSIS: NEGATIVE
HPV: NOT DETECTED

## 2023-06-08 NOTE — H&P (Signed)
 Melanie Short is an 33 y.o. female. 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   Pertinent Gynecological History: Menses: flow is moderate Bleeding:  Contraception: none DES exposure: denies Blood transfusions: none Sexually transmitted diseases: past history: chlamydia Previous GYN Procedures:  LEEP   Last mammogram: Last pap: normal Date: 04/29/23 OB History: G0, P0   Menstrual History: Menarche age:  No LMP recorded.    Past Medical History:  Diagnosis Date   Bacterial vaginosis    Ovarian cyst    Ovarian cyst    Trichomonas vaginitis    Vaginal Pap smear, abnormal    LGSIL    Past Surgical History:  Procedure Laterality Date   CERVICAL ABLATION N/A 12/10/2016   Procedure: LASER ABLATION OF CERVIX;  Surgeon: Lazaro Arms, MD;  Location: [facility];  Service: Gynecology;  Laterality: N/A;   COLPOSCOPY  08/26/2016   NOSE SURGERY     broken nose    Family History  Problem Relation Age of Onset   Fibromyalgia Paternal Grandmother    Hypertension Paternal Grandmother    Heart attack Maternal Grandmother    Stroke Maternal Grandmother    Cancer Maternal Grandmother        kidney   Asthma Father    Ovarian cysts Mother    Fibromyalgia Mother    Cancer Paternal Grandfather        lung    Social History:  reports that she has been smoking e-cigarettes. She has never used smokeless tobacco. She reports current alcohol use. She reports current drug use. Drug: Marijuana.  Allergies: No Known Allergies  No medications prior to admission.    Review of Systems  Constitutional:  Negative for fever.    There were no vitals taken for this visit. Physical Exam Cardiovascular:     Rate and Rhythm: Normal rate.  Pulmonary:     Effort: Pulmonary effort is normal.     No results found for this or any previous visit (from the past 24 hours).  No results found.  Assessment/Plan: 32 yo for H/S, D&C, possible Myosure resection D/W  procedure and risks including infection, uterine perforation and organ damage, bleeding/transfusion-HIV/Hep, DVT/PE, pneumonia.  She states she understands and agrees.  Roselle Locus II 06/08/2023, 11:28 AM

## 2023-06-11 ENCOUNTER — Encounter (HOSPITAL_COMMUNITY): Payer: Self-pay | Admitting: Obstetrics and Gynecology

## 2023-06-11 ENCOUNTER — Other Ambulatory Visit: Payer: Self-pay

## 2023-06-11 NOTE — Progress Notes (Signed)
 SDW CALL  Patient was given pre-op instructions over the phone. The opportunity was given for the patient to ask questions. No further questions asked. Patient verbalized understanding of instructions given.   PCP - does not have one at this time Cardiologist - denies  PPM/ICD - denies Device Orders - n/a Rep Notified - n/a  Chest x-ray - denies EKG - denies Stress Test - denies ECHO - denies Cardiac Cath - denies  Sleep Study - denies CPAP - n/a  Patient states that she is pre-diabetic but does not check blood sugars at this time  Last dose of GLP1 agonist-  n/a GLP1 instructions: n/a  Blood Thinner Instructions: n/a Aspirin Instructions: n/a  ERAS Protcol - NPO   COVID TEST- n/a   Anesthesia review: no  Patient denies shortness of breath, fever, cough and chest pain over the phone call   All instructions explained to the patient, with a verbal understanding of the material. Patient agrees to go over the instructions while at home for a better understanding.    Teach back method used for arrival time.  Patient stated that she is driving herself to the hospital but her boyfriend is coming when he gets off work, he works 3rd shift.  Patient is aware that she can not drive herself home and her procedure will be canceled if she does not have a driver and someone to stay with her for 24 hours after the surgery.  Patient states that her boyfriend will be here to drive her home and will stay with her for 24 hours.

## 2023-06-14 NOTE — Anesthesia Preprocedure Evaluation (Signed)
 Anesthesia Evaluation  Patient identified by MRN, date of birth, ID band Patient awake    Reviewed: Allergy & Precautions, NPO status , Patient's Chart, lab work & pertinent test results  Airway Mallampati: I  TM Distance: >3 FB Neck ROM: Full    Dental  (+) Dental Advisory Given, Missing,    Pulmonary Current Smoker and Patient abstained from smoking.   Pulmonary exam normal breath sounds clear to auscultation       Cardiovascular negative cardio ROS Normal cardiovascular exam Rhythm:Regular Rate:Normal     Neuro/Psych negative neurological ROS  negative psych ROS   GI/Hepatic negative GI ROS, Neg liver ROS,,,  Endo/Other  diabetes (prediabetes)  Class 3 obesity (BMI 48)  Renal/GU negative Renal ROS  negative genitourinary   Musculoskeletal negative musculoskeletal ROS (+)    Abdominal   Peds  Hematology negative hematology ROS (+)   Anesthesia Other Findings   Reproductive/Obstetrics                             Anesthesia Physical Anesthesia Plan  ASA: 3  Anesthesia Plan: General   Post-op Pain Management: Tylenol PO (pre-op)*   Induction: Intravenous  PONV Risk Score and Plan: 2 and Ondansetron, Dexamethasone and Midazolam  Airway Management Planned: LMA  Additional Equipment:   Intra-op Plan:   Post-operative Plan: Extubation in OR  Informed Consent: I have reviewed the patients History and Physical, chart, labs and discussed the procedure including the risks, benefits and alternatives for the proposed anesthesia with the patient or authorized representative who has indicated his/her understanding and acceptance.     Dental advisory given  Plan Discussed with: CRNA  Anesthesia Plan Comments:        Anesthesia Quick Evaluation

## 2023-06-15 ENCOUNTER — Ambulatory Visit (HOSPITAL_COMMUNITY): Payer: Self-pay | Admitting: Anesthesiology

## 2023-06-15 ENCOUNTER — Encounter (HOSPITAL_COMMUNITY): Payer: Self-pay | Admitting: Obstetrics and Gynecology

## 2023-06-15 ENCOUNTER — Other Ambulatory Visit: Payer: Self-pay

## 2023-06-15 ENCOUNTER — Ambulatory Visit (HOSPITAL_COMMUNITY)
Admission: RE | Admit: 2023-06-15 | Discharge: 2023-06-15 | Disposition: A | Discharge status: Home / Self Care | Attending: Obstetrics and Gynecology | Admitting: Obstetrics and Gynecology

## 2023-06-15 ENCOUNTER — Encounter (HOSPITAL_COMMUNITY)
Admission: RE | Disposition: A | Discharge status: Home / Self Care | Payer: Self-pay | Source: Home / Self Care | Attending: Obstetrics and Gynecology

## 2023-06-15 DIAGNOSIS — F17290 Nicotine dependence, other tobacco product, uncomplicated: Secondary | ICD-10-CM | POA: Diagnosis not present

## 2023-06-15 DIAGNOSIS — E66813 Obesity, class 3: Secondary | ICD-10-CM | POA: Insufficient documentation

## 2023-06-15 DIAGNOSIS — R7303 Prediabetes: Secondary | ICD-10-CM | POA: Diagnosis not present

## 2023-06-15 DIAGNOSIS — Z6841 Body Mass Index (BMI) 40.0 and over, adult: Secondary | ICD-10-CM | POA: Insufficient documentation

## 2023-06-15 DIAGNOSIS — N979 Female infertility, unspecified: Secondary | ICD-10-CM

## 2023-06-15 DIAGNOSIS — N856 Intrauterine synechiae: Secondary | ICD-10-CM | POA: Insufficient documentation

## 2023-06-15 HISTORY — DX: Prediabetes: R73.03

## 2023-06-15 HISTORY — PX: HYSTEROSCOPY WITH D & C: SHX1775

## 2023-06-15 LAB — CBC
HCT: 45.4 % (ref 36.0–46.0)
Hemoglobin: 14.7 g/dL (ref 12.0–15.0)
MCH: 30.6 pg (ref 26.0–34.0)
MCHC: 32.4 g/dL (ref 30.0–36.0)
MCV: 94.4 fL (ref 80.0–100.0)
Platelets: 279 10*3/uL (ref 150–400)
RBC: 4.81 MIL/uL (ref 3.87–5.11)
RDW: 12.8 % (ref 11.5–15.5)
WBC: 6.9 10*3/uL (ref 4.0–10.5)
nRBC: 0 % (ref 0.0–0.2)

## 2023-06-15 LAB — ABO/RH: ABO/RH(D): O POS

## 2023-06-15 LAB — POCT PREGNANCY, URINE: Preg Test, Ur: NEGATIVE

## 2023-06-15 LAB — TYPE AND SCREEN
ABO/RH(D): O POS
Antibody Screen: NEGATIVE

## 2023-06-15 SURGERY — DILATATION AND CURETTAGE /HYSTEROSCOPY
Anesthesia: General

## 2023-06-15 MED ORDER — AMISULPRIDE (ANTIEMETIC) 5 MG/2ML IV SOLN: 10.0000 mg | Freq: Once | INTRAVENOUS | Status: DC | PRN

## 2023-06-15 MED ORDER — LIDOCAINE HCL 1 % IJ SOLN
INTRAMUSCULAR | Status: DC | PRN
  Administered 2023-06-15: 20 mL

## 2023-06-15 MED ORDER — MIDAZOLAM HCL 2 MG/2ML IJ SOLN
INTRAMUSCULAR | Status: AC
  Filled 2023-06-15: qty 2

## 2023-06-15 MED ORDER — ONDANSETRON HCL 4 MG/2ML IJ SOLN
INTRAMUSCULAR | Status: AC
  Filled 2023-06-15: qty 2

## 2023-06-15 MED ORDER — LACTATED RINGERS IV SOLN
INTRAVENOUS | Status: DC
Start: 2023-06-15 — End: 2023-06-15

## 2023-06-15 MED ORDER — FENTANYL CITRATE (PF) 250 MCG/5ML IJ SOLN
INTRAMUSCULAR | Status: AC
  Filled 2023-06-15: qty 5

## 2023-06-15 MED ORDER — DEXAMETHASONE SODIUM PHOSPHATE 10 MG/ML IJ SOLN
INTRAMUSCULAR | Status: DC | PRN
Start: 2023-06-15 — End: 2023-06-15
  Administered 2023-06-15: 8 mg via INTRAVENOUS

## 2023-06-15 MED ORDER — FENTANYL CITRATE (PF) 100 MCG/2ML IJ SOLN
INTRAMUSCULAR | Status: DC | PRN
  Administered 2023-06-15 (×3): 50 ug via INTRAVENOUS

## 2023-06-15 MED ORDER — CHLORHEXIDINE GLUCONATE 0.12 % MT SOLN
15.0000 mL | Freq: Once | OROMUCOSAL | Status: AC
  Administered 2023-06-15: 15 mL via OROMUCOSAL
  Filled 2023-06-15: qty 15

## 2023-06-15 MED ORDER — LACTATED RINGERS IV SOLN: INTRAVENOUS | Status: DC

## 2023-06-15 MED ORDER — PHENYLEPHRINE 80 MCG/ML (10ML) SYRINGE FOR IV PUSH (FOR BLOOD PRESSURE SUPPORT)
PREFILLED_SYRINGE | INTRAVENOUS | Status: DC | PRN
  Administered 2023-06-15: 80 ug via INTRAVENOUS

## 2023-06-15 MED ORDER — ACETAMINOPHEN 500 MG PO TABS
1000.0000 mg | ORAL_TABLET | Freq: Once | ORAL | Status: AC
  Administered 2023-06-15: 1000 mg via ORAL

## 2023-06-15 MED ORDER — LIDOCAINE 2% (20 MG/ML) 5 ML SYRINGE
INTRAMUSCULAR | Status: DC | PRN
  Administered 2023-06-15: 100 mg via INTRAVENOUS

## 2023-06-15 MED ORDER — CEFAZOLIN SODIUM-DEXTROSE 3-4 GM/150ML-% IV SOLN
3.0000 g | INTRAVENOUS | Status: AC
  Administered 2023-06-15: 3 g via INTRAVENOUS
  Filled 2023-06-15: qty 150

## 2023-06-15 MED ORDER — ORAL CARE MOUTH RINSE: 15.0000 mL | Freq: Once | OROMUCOSAL | Status: AC

## 2023-06-15 MED ORDER — ACETAMINOPHEN 500 MG PO TABS
1000.0000 mg | ORAL_TABLET | ORAL | Status: DC
  Filled 2023-06-15: qty 2

## 2023-06-15 MED ORDER — SOD CITRATE-CITRIC ACID 500-334 MG/5ML PO SOLN
30.0000 mL | ORAL | Status: AC
  Administered 2023-06-15: 30 mL via ORAL
  Filled 2023-06-15: qty 30

## 2023-06-15 MED ORDER — FENTANYL CITRATE (PF) 100 MCG/2ML IJ SOLN: 25.0000 ug | INTRAMUSCULAR | Status: DC | PRN

## 2023-06-15 MED ORDER — LIDOCAINE HCL 1 % IJ SOLN
INTRAMUSCULAR | Status: AC
  Filled 2023-06-15: qty 20

## 2023-06-15 MED ORDER — EPHEDRINE SULFATE-NACL 50-0.9 MG/10ML-% IV SOSY
PREFILLED_SYRINGE | INTRAVENOUS | Status: DC | PRN
  Administered 2023-06-15: 10 mg via INTRAVENOUS
  Administered 2023-06-15: 5 mg via INTRAVENOUS
  Administered 2023-06-15: 10 mg via INTRAVENOUS

## 2023-06-15 MED ORDER — CELECOXIB 200 MG PO CAPS
400.0000 mg | ORAL_CAPSULE | ORAL | Status: AC
  Administered 2023-06-15: 400 mg via ORAL
  Filled 2023-06-15: qty 2

## 2023-06-15 MED ORDER — PROPOFOL 10 MG/ML IV BOLUS
INTRAVENOUS | Status: AC
  Filled 2023-06-15: qty 20

## 2023-06-15 MED ORDER — PROPOFOL 10 MG/ML IV BOLUS
INTRAVENOUS | Status: DC | PRN
Start: 2023-06-15 — End: 2023-06-15
  Administered 2023-06-15: 200 mg via INTRAVENOUS

## 2023-06-15 MED ORDER — MIDAZOLAM HCL 2 MG/2ML IJ SOLN
INTRAMUSCULAR | Status: DC | PRN
Start: 2023-06-15 — End: 2023-06-15
  Administered 2023-06-15: 2 mg via INTRAVENOUS

## 2023-06-15 MED ORDER — ONDANSETRON HCL 4 MG/2ML IJ SOLN
INTRAMUSCULAR | Status: DC | PRN
  Administered 2023-06-15: 4 mg via INTRAVENOUS

## 2023-06-15 MED ORDER — POVIDONE-IODINE 10 % EX SWAB
2.0000 | Freq: Once | CUTANEOUS | Status: AC
  Administered 2023-06-15: 2 via TOPICAL

## 2023-06-15 MED ORDER — LIDOCAINE 2% (20 MG/ML) 5 ML SYRINGE
INTRAMUSCULAR | Status: AC
  Filled 2023-06-15: qty 5

## 2023-06-15 MED ORDER — OXYCODONE HCL 5 MG PO TABS: 5.0000 mg | ORAL_TABLET | Freq: Once | ORAL | Status: DC | PRN

## 2023-06-15 MED ORDER — DEXAMETHASONE SODIUM PHOSPHATE 10 MG/ML IJ SOLN
INTRAMUSCULAR | Status: AC
  Filled 2023-06-15: qty 1

## 2023-06-15 MED ORDER — OXYCODONE HCL 5 MG/5ML PO SOLN: 5.0000 mg | Freq: Once | ORAL | Status: DC | PRN

## 2023-06-15 SURGICAL SUPPLY — 13 items
CATH ROBINSON RED A/P 16FR (CATHETERS) ×1 IMPLANT
ELECT COAG BIPOL BALL 21FR (ELECTRODE) ×1 IMPLANT
ELECT REM PT RETURN 9FT ADLT (ELECTROSURGICAL) IMPLANT
ELECTRODE REM PT RTRN 9FT ADLT (ELECTROSURGICAL) IMPLANT
GLOVE BIO SURGEON STRL SZ8 (GLOVE) ×2 IMPLANT
GLOVE BIOGEL PI IND STRL 8 (GLOVE) ×1 IMPLANT
GLOVE SURG UNDER POLY LF SZ7 (GLOVE) ×1 IMPLANT
GOWN STRL REUS W/ TWL LRG LVL3 (GOWN DISPOSABLE) ×2 IMPLANT
KIT PROCEDURE FLUENT (KITS) ×1 IMPLANT
LOOP CUTTING BIPOLAR 21FR (ELECTRODE) IMPLANT
PACK VAGINAL MINOR WOMEN LF (CUSTOM PROCEDURE TRAY) ×1 IMPLANT
PAD OB MATERNITY 11 LF (PERSONAL CARE ITEMS) ×1 IMPLANT
TOWEL GREEN STERILE FF (TOWEL DISPOSABLE) ×2 IMPLANT

## 2023-06-15 NOTE — Transfer of Care (Signed)
 Immediate Anesthesia Transfer of Care Note  Patient: Psalms Olarte  Procedure(s) Performed: DILATATION AND CURETTAGE /HYSTEROSCOPY  Patient Location: PACU  Anesthesia Type:General  Level of Consciousness: awake, alert , and oriented  Airway & Oxygen Therapy: Patient Spontanous Breathing and Patient connected to face mask oxygen  Post-op Assessment: Report given to RN and Post -op Vital signs reviewed and stable  Post vital signs: stable and unstable  Last Vitals:  Vitals Value Taken Time  BP 105/60 06/15/23 0833  Temp    Pulse 85 06/15/23 0838  Resp 17 06/15/23 0838  SpO2 98 % 06/15/23 0838  Vitals shown include unfiled device data.  Last Pain:  Vitals:   06/15/23 0629  TempSrc:   PainSc: 0-No pain         Complications: No notable events documented.

## 2023-06-15 NOTE — Progress Notes (Signed)
 No changes to H&P per patient history Reviewed procedure-hysteroscopy, dilation and curettage, possible Myosure resection Post operative instructions reviewed NKDA She states she understands and agrees.

## 2023-06-15 NOTE — Discharge Instructions (Signed)

## 2023-06-15 NOTE — Progress Notes (Signed)
 06/15/2023  8:35 AM  PATIENT:  Melanie Short  33 y.o. female  PRE-OPERATIVE DIAGNOSIS:  INTERUTERINE SYNECHIAE  POST-OPERATIVE DIAGNOSIS:  INTERUTERINE SYNECHIAE  PROCEDURE:  Hysteroscopy, Dilation and Curettage  SURGEON:  Surgeons and Role:    * Harold Hedge, MD - Primary  PHYSICIAN ASSISTANT:   ASSISTANTS: none   ANESTHESIA:   general  EBL:  10 ml   BLOOD ADMINISTERED:none  DRAINS: none   LOCAL MEDICATIONS USED:  LIDOCAINE  and Amount: 20 ml  SPECIMEN:  Source of Specimen:  endometrial curettings  DISPOSITION OF SPECIMEN:  PATHOLOGY  COUNTS:  YES  TOURNIQUET:  * No tourniquets in log *  DICTATION: .Other Dictation: Dictation Number [id number]  PLAN OF CARE: Discharge to home after PACU  PATIENT DISPOSITION:  PACU - hemodynamically stable.   Delay start of Pharmacological VTE agent (>24hrs) due to surgical blood loss or risk of bleeding: not applicable

## 2023-06-15 NOTE — Anesthesia Postprocedure Evaluation (Signed)
 Anesthesia Post Note  Patient: Melanie Short  Procedure(s) Performed: DILATATION AND CURETTAGE /HYSTEROSCOPY     Patient location during evaluation: PACU Anesthesia Type: General Level of consciousness: awake and alert Pain management: pain level controlled Vital Signs Assessment: post-procedure vital signs reviewed and stable Respiratory status: spontaneous breathing, nonlabored ventilation, respiratory function stable and patient connected to nasal cannula oxygen Cardiovascular status: blood pressure returned to baseline and stable Postop Assessment: no apparent nausea or vomiting Anesthetic complications: no  No notable events documented.  Last Vitals:  Vitals:   06/15/23 0900 06/15/23 0915  BP: 101/60 99/63  Pulse: 79 77  Resp: 12 16  Temp:  36.4 C  SpO2: 91% 91%    Last Pain:  Vitals:   06/15/23 0915  TempSrc:   PainSc: 4                  Jamere Stidham L Lynden Carrithers

## 2023-06-15 NOTE — Anesthesia Procedure Notes (Signed)
 Procedure Name: LMA Insertion Date/Time: 06/15/2023 7:40 AM  Performed by: Ovidio Kin, CRNAPre-anesthesia Checklist: Patient identified, Emergency Drugs available, Suction available and Patient being monitored Patient Re-evaluated:Patient Re-evaluated prior to induction Oxygen Delivery Method: Circle system utilized Preoxygenation: Pre-oxygenation with 100% oxygen Induction Type: IV induction Ventilation: Mask ventilation without difficulty LMA: LMA inserted LMA Size: 4.0 Number of attempts: 1 Placement Confirmation: positive ETCO2, CO2 detector and breath sounds checked- equal and bilateral Dental Injury: Teeth and Oropharynx as per pre-operative assessment

## 2023-06-15 NOTE — Progress Notes (Signed)
 Delay in discharge d/t ride not being here. Patient ready for discharge at 0930.

## 2023-06-15 NOTE — Op Note (Signed)
 [name], CHANNELL MEDICAL RECORD NO: [id number] ACCOUNT NO: [id number] DATE OF BIRTH: Sep 05, 1990 FACILITY: [facility] LOCATION: [facility] PHYSICIAN: Guy Sandifer. Arleta Creek, MD  Operative Report   DATE OF PROCEDURE: 06/15/2023  PREOPERATIVE DIAGNOSIS:  Intrauterine synechia.  POSTOPERATIVE DIAGNOSIS:  Intrauterine synechia.  PROCEDURE:  Hysteroscopy with dilation and curettage.  SURGEON:  Guy Sandifer. Arleta Creek, MD  ANESTHESIA:  General with LMA.  ESTIMATED BLOOD LOSS:  10 mL.  SPECIMENS:  Endometrial curettings to pathology.  I'S AND O'S DEFICIT OF DISTENDING MEDIA: 45 mL.  INDICATIONS AND CONSENT:  This patient is a 33 year old patient with a question of intrauterine synechia on hysterosalpingogram.  Hysteroscopy, D and C, possible MyoSure has been discussed preoperatively.  Potential risks and complications were discussed  preoperatively including but not limited to infection, organ damage, bleeding requiring transfusion of blood products with HIV and hepatitis acquisition, DVT, PE, pneumonia, recurrent synechia.  She states she understands and agrees.  All questions were  answered and consent was signed on the chart.  FINDINGS:  Both fallopian tube ostia are noted.  There is an adhesion from the anterior to the posterior wall of the endometrial cavity in the upper one-third on the patient's right side.  There is good distention of the endometrial cavity before and  after curettage.  DESCRIPTION OF PROCEDURE:  The patient was taken to the operating room where she was identified and placed in the dorsal supine position and general anesthesia was induced via LMA.  She was placed in the dorsal lithotomy position.  Timeout was done.  She  was prepped vaginally with Betadine.  Bladder straight catheterized and draped in a sterile fashion.  Bivalve speculum was placed in the vagina.  The anterior cervical lip was injected with 1% lidocaine and grasped with a single-tooth tenaculum.   Paracervical  block was placed at the 2, 4, 5, 7, 8, and 10 o'clock positions with approximately 20 mL of the same solution.  Cervix was gently progressively dilated.  Hysteroscope was placed in the endocervical canal and advanced under direct  visualization using distending media.  The above findings were noted.  Hysteroscope was withdrawn and then sharp curettage was done.  Tissue was sent to pathology.  Reinspection with the hysteroscope reveals the cavity to be clean.  The adhesion is gone  and there is good distention of the cavity.  Instruments are removed.  Good hemostasis is noted.  All counts are correct.  The patient is awake and taken to the recovery room in stable condition.   VAI D: 06/15/2023 8:41:23 am T: 06/15/2023 9:20:00 am  JOB: [id number]/ [id number]

## 2023-06-16 ENCOUNTER — Encounter (HOSPITAL_COMMUNITY): Payer: Self-pay | Admitting: Obstetrics and Gynecology

## 2023-06-16 LAB — SURGICAL PATHOLOGY
